# Patient Record
Sex: Female | Born: 1940 | Race: White | Hispanic: No | Marital: Married | Smoking: Never smoker
Health system: Southern US, Community
[De-identification: ages and names within clinical notes are randomized; demographics above are authoritative.]

## PROBLEM LIST (undated history)

## (undated) DIAGNOSIS — I10 Essential (primary) hypertension: Secondary | ICD-10-CM

## (undated) DIAGNOSIS — K219 Gastro-esophageal reflux disease without esophagitis: Secondary | ICD-10-CM

## (undated) DIAGNOSIS — E7800 Pure hypercholesterolemia, unspecified: Secondary | ICD-10-CM

## (undated) DIAGNOSIS — N390 Urinary tract infection, site not specified: Secondary | ICD-10-CM

## (undated) DIAGNOSIS — C801 Malignant (primary) neoplasm, unspecified: Secondary | ICD-10-CM

## (undated) DIAGNOSIS — Z9109 Other allergy status, other than to drugs and biological substances: Secondary | ICD-10-CM

## (undated) HISTORY — DX: Other allergy status, other than to drugs and biological substances: Z91.09

## (undated) HISTORY — PX: NO PAST SURGERIES: SHX2092

## (undated) HISTORY — DX: Urinary tract infection, site not specified: N39.0

## (undated) HISTORY — DX: Essential (primary) hypertension: I10

## (undated) HISTORY — DX: Pure hypercholesterolemia, unspecified: E78.00

## (undated) HISTORY — DX: Gastro-esophageal reflux disease without esophagitis: K21.9

---

## 2015-07-11 DIAGNOSIS — E7800 Pure hypercholesterolemia, unspecified: Secondary | ICD-10-CM | POA: Insufficient documentation

## 2015-07-11 DIAGNOSIS — K219 Gastro-esophageal reflux disease without esophagitis: Secondary | ICD-10-CM | POA: Insufficient documentation

## 2015-07-11 DIAGNOSIS — Z87440 Personal history of urinary (tract) infections: Secondary | ICD-10-CM | POA: Insufficient documentation

## 2015-11-05 ENCOUNTER — Other Ambulatory Visit: Payer: Self-pay | Admitting: Internal Medicine

## 2015-11-05 ENCOUNTER — Ambulatory Visit: Payer: Self-pay

## 2015-11-05 DIAGNOSIS — R05 Cough: Secondary | ICD-10-CM

## 2015-11-05 DIAGNOSIS — R053 Chronic cough: Secondary | ICD-10-CM

## 2015-11-07 ENCOUNTER — Ambulatory Visit
Admission: RE | Admit: 2015-11-07 | Discharge: 2015-11-07 | Disposition: A | Discharge status: Home / Self Care | Payer: Medicare HMO | Source: Ambulatory Visit | Attending: Internal Medicine | Admitting: Internal Medicine

## 2015-11-07 DIAGNOSIS — R05 Cough: Secondary | ICD-10-CM | POA: Insufficient documentation

## 2015-11-07 DIAGNOSIS — I2510 Atherosclerotic heart disease of native coronary artery without angina pectoris: Secondary | ICD-10-CM | POA: Insufficient documentation

## 2015-11-07 DIAGNOSIS — R918 Other nonspecific abnormal finding of lung field: Secondary | ICD-10-CM | POA: Diagnosis not present

## 2015-11-07 DIAGNOSIS — R053 Chronic cough: Secondary | ICD-10-CM

## 2015-11-17 ENCOUNTER — Other Ambulatory Visit: Payer: Self-pay | Admitting: Internal Medicine

## 2015-11-17 DIAGNOSIS — R05 Cough: Secondary | ICD-10-CM

## 2015-11-17 DIAGNOSIS — R053 Chronic cough: Secondary | ICD-10-CM

## 2015-11-17 DIAGNOSIS — J189 Pneumonia, unspecified organism: Secondary | ICD-10-CM

## 2015-11-25 ENCOUNTER — Other Ambulatory Visit: Payer: Self-pay | Admitting: Internal Medicine

## 2015-11-25 DIAGNOSIS — J189 Pneumonia, unspecified organism: Secondary | ICD-10-CM

## 2015-11-25 DIAGNOSIS — R05 Cough: Secondary | ICD-10-CM

## 2015-11-25 DIAGNOSIS — R053 Chronic cough: Secondary | ICD-10-CM

## 2015-12-16 ENCOUNTER — Ambulatory Visit
Admission: RE | Admit: 2015-12-16 | Discharge: 2015-12-16 | Disposition: A | Discharge status: Home / Self Care | Payer: Medicare HMO | Source: Ambulatory Visit | Attending: Internal Medicine | Admitting: Internal Medicine

## 2015-12-16 DIAGNOSIS — R05 Cough: Secondary | ICD-10-CM | POA: Diagnosis present

## 2015-12-16 DIAGNOSIS — J189 Pneumonia, unspecified organism: Secondary | ICD-10-CM | POA: Insufficient documentation

## 2015-12-16 DIAGNOSIS — R053 Chronic cough: Secondary | ICD-10-CM

## 2016-03-31 ENCOUNTER — Encounter: Payer: Self-pay | Admitting: Obstetrics and Gynecology

## 2016-03-31 ENCOUNTER — Ambulatory Visit (INDEPENDENT_AMBULATORY_CARE_PROVIDER_SITE_OTHER): Payer: Medicare HMO | Admitting: Obstetrics and Gynecology

## 2016-03-31 VITALS — BP 156/74 | HR 81 | Ht 62.0 in | Wt 183.0 lb

## 2016-03-31 DIAGNOSIS — R3915 Urgency of urination: Secondary | ICD-10-CM | POA: Diagnosis not present

## 2016-03-31 DIAGNOSIS — N390 Urinary tract infection, site not specified: Secondary | ICD-10-CM | POA: Diagnosis not present

## 2016-03-31 DIAGNOSIS — N814 Uterovaginal prolapse, unspecified: Secondary | ICD-10-CM

## 2016-03-31 DIAGNOSIS — Z780 Asymptomatic menopausal state: Secondary | ICD-10-CM | POA: Diagnosis not present

## 2016-03-31 DIAGNOSIS — N952 Postmenopausal atrophic vaginitis: Secondary | ICD-10-CM | POA: Diagnosis not present

## 2016-03-31 DIAGNOSIS — R339 Retention of urine, unspecified: Secondary | ICD-10-CM | POA: Diagnosis not present

## 2016-03-31 NOTE — Progress Notes (Signed)
GYN ENCOUNTER NOTE  Subjective:       Katie Beltran is a 75 y.o. [id number] female is here for gynecologic evaluation of the following issues:  1. Pelvic organ prolapse.    Menopause-late 50s 5 spontaneous vaginal deliveries, largest 9 lbs. 1 oz. No history of HRT use No current vasomotor symptoms  Patient reports pelvic pressure and protrusion of mass from the vaginal introitus which is progressively worsening over the past 3 years. She is experiencing recurrent UTIs. She does report having difficulty with staying clean after bowel movements because of the prolapsing mass. She does report backache feet with worsening of prolapse; prolapse improves with supine positioning.  GU: Urinary frequency 8 daily Nocturia 3 Mild urgency symptoms are notable. Stress urinary incontinence-negative Incomplete bladder emptying-at times Patient occasionally splints to initiate urinary stream. Currently on antibiotic prophylaxis-Macrodantin   Gynecologic History No LMP recorded. Patient is postmenopausal. Contraception: post menopausal status  Obstetric History OB History  Gravida Para Term Preterm AB Living  5 5 5     5   SAB TAB Ectopic Multiple Live Births          5    # Outcome Date GA Lbr Len/2nd Weight Sex Delivery Anes PTL Lv  5 Term 1974   8 lb 14.4 oz (4.037 kg) F Vag-Spont   LIV  4 Term 1967   9 lb 1.6 oz (4.128 kg) M Vag-Spont   LIV  3 Term 1965   6 lb 4.8 oz (2.858 kg) F Vag-Spont   LIV  2 Term 1963   7 lb 2.2 oz (3.239 kg) M Vag-Spont   LIV  1 Term 1962   8 lb 4.8 oz (3.765 kg) M Vag-Spont   LIV      Past Medical History:  Diagnosis Date  . Acid reflux   . Environmental allergies   . Hypercholesteremia   . Hypertension   . Recurrent UTI     Past Surgical History:  Procedure Laterality Date  . NO PAST SURGERIES      No current outpatient prescriptions on file prior to visit.   No current facility-administered medications on file prior to visit.     Allergies   Allergen Reactions  . Ciprofloxacin Other (See Comments)    Chest tightness  . Symbicort [Budesonide-Formoterol Fumarate]     Chills fever  . Prednisone Rash    Social History   Social History  . Marital status: Married    Spouse name: N/A  . Number of children: N/A  . Years of education: N/A   Occupational History  . Not on file.   Social History Main Topics  . Smoking status: Never Smoker  . Smokeless tobacco: Not on file  . Alcohol use No  . Drug use: No  . Sexual activity: No   Other Topics Concern  . Not on file   Social History Narrative  . No narrative on file    Family History  Problem Relation Age of Onset  . Diabetes Mother   . Heart disease Mother   . Diabetes Maternal Aunt   . Heart disease Maternal Aunt   . Heart disease Maternal Grandmother   . Colon cancer Son   . Breast cancer Neg Hx   . Ovarian cancer Neg Hx     The following portions of the patient's history were reviewed and updated as appropriate: allergies, current medications, past family history, past medical history, past social history, past surgical history and problem list.  Review  of Systems Review of Systems - Per history of present illness  Objective:   BP (!) 156/74   Pulse 81   Ht 5\' 2"  (1.575 m)   Wt 183 lb (83 kg)   BMI 33.47 kg/m  CONSTITUTIONAL: Well-developed, well-nourished female in no acute distress.  HENT:  Normocephalic, atraumatic.  NECK: Normal range of motion, supple, no masses.  Normal thyroid.  SKIN: Skin is warm and dry. No rash noted. Not diaphoretic. No erythema. No pallor. [place]: Alert and oriented to person, place, and time. PSYCHIATRIC: Normal mood and affect. Normal behavior. Normal judgment and thought content. CARDIOVASCULAR:Not Examined RESPIRATORY: Not Examined BREASTS: Not Examined ABDOMEN: Soft, non distended; Non tender.  No Organomegaly. PELVIC:  External Genitalia: Normal  BUS: Normal  Vagina: Mild to moderate atrophy;  third-degree cystocele; no rectocele  Cervix: Normal; parous, descends to mid vagina with Valsalva  Uterus: Normal size, shape,consistency, mobile, nontender  Adnexa: Normal, nonpalpable and nontender  RV: Normal external exam  Bladder: Nontender MUSCULOSKELETAL: Normal range of motion. No tenderness.  No cyanosis, clubbing, or edema.   PROCEDURE: Pessary fitting #4 ring with support-uncomfortable #3 incontinence dish with support-uncomfortable #2 incontinence dish with support-excellent  Assessment:   1. 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 Frequent UTI  3. Urinary urgency  4. Menopause  5. Vaginal atrophy  6. Incomplete bladder emptying     Plan:   1. Pessary fitting was completed today. A #2 incontinence dish with support is ordered 2. Pt will be contacted when the pessary arrives for insertion 3. Use estrogen cream intravaginal twice a week  A total of 45 minutes were spent face-to-face with the patient during the encounter with greater than 50% dealing with counseling and coordination of care.  Brayton Mars, MD  Note: This dictation was prepared with Dragon dictation along with smaller phrase technology. Any transcriptional errors that result from this process are unintentional.

## 2016-03-31 NOTE — Patient Instructions (Signed)
1. Pessary fitting was completed today. A #2 incontinence dish with support is ordered 2. You will be contacted when the pessary arrives for insertion 3. Use estrogen cream intravaginal twice a week

## 2016-04-13 ENCOUNTER — Ambulatory Visit (INDEPENDENT_AMBULATORY_CARE_PROVIDER_SITE_OTHER): Payer: Medicare HMO | Admitting: Obstetrics and Gynecology

## 2016-04-13 ENCOUNTER — Encounter: Payer: Self-pay | Admitting: Obstetrics and Gynecology

## 2016-04-13 VITALS — BP 168/73 | HR 85 | Wt 183.9 lb

## 2016-04-13 DIAGNOSIS — N952 Postmenopausal atrophic vaginitis: Secondary | ICD-10-CM | POA: Diagnosis not present

## 2016-04-13 DIAGNOSIS — N814 Uterovaginal prolapse, unspecified: Secondary | ICD-10-CM | POA: Diagnosis not present

## 2016-04-13 NOTE — Patient Instructions (Signed)
1. Return in 2 weeks for pessary maintenance

## 2016-04-13 NOTE — Progress Notes (Signed)
Chief complaint: 1. Third-degree cystocele with uterine prolapse to mid vagina 2. Vaginal atrophy 3. Incomplete bladder emptying  Patient presents for a new pessary insertion-#2 incontinence dish  OBJECTIVE: BP (!) 168/73   Pulse 85   Ht (P) 5\' 2"  (1.575 m)   Wt 183 lb 14.4 oz (83.4 kg)   BMI (P) 33.64 kg/m   PELVIC:             External Genitalia: Normal             BUS: Normal             Vagina: Mild to moderate atrophy; third-degree cystocele; no rectocele             Cervix: Normal; parous, descends to mid vagina with Valsalva             Uterus: Normal size, shape,consistency, mobile, nontender             Adnexa: Normal, nonpalpable and nontender             RV: Normal external exam             Bladder: Nontender  PROCEDURE: #2 incontinence dish inserted  ASSESSMENT: 1. Third-degree cystocele with uterine prolapse to mid vagina 2. Vaginal atrophy 3. Incomplete bladder emptying 4. Successful pessary insertion  PLAN: 1. Return in 2 weeks for follow-up-pessary maintenance 2. Use Trimosan gel weekly 3. Discussed use of Intrarosa for vaginal atrophy  A total of 15 minutes were spent face-to-face with the patient during this encounter and over half of that time dealt with counseling and coordination of care.   Brayton Mars, MD  Note: This dictation was prepared with Dragon dictation along with smaller phrase technology. Any transcriptional errors that result from this process are unintentional.

## 2016-04-15 ENCOUNTER — Encounter: Payer: Self-pay | Admitting: Obstetrics and Gynecology

## 2016-04-15 ENCOUNTER — Ambulatory Visit (INDEPENDENT_AMBULATORY_CARE_PROVIDER_SITE_OTHER): Payer: Medicare HMO | Admitting: Obstetrics and Gynecology

## 2016-04-15 VITALS — BP 156/78 | HR 89 | Ht 62.0 in | Wt 185.6 lb

## 2016-04-15 DIAGNOSIS — R102 Pelvic and perineal pain: Secondary | ICD-10-CM | POA: Insufficient documentation

## 2016-04-15 DIAGNOSIS — N814 Uterovaginal prolapse, unspecified: Secondary | ICD-10-CM

## 2016-04-15 DIAGNOSIS — R339 Retention of urine, unspecified: Secondary | ICD-10-CM

## 2016-04-15 DIAGNOSIS — Z4689 Encounter for fitting and adjustment of other specified devices: Secondary | ICD-10-CM | POA: Diagnosis not present

## 2016-04-15 NOTE — Patient Instructions (Signed)
Return for pessary appointment as scheduled or sooner if other problems develop

## 2016-04-15 NOTE — Progress Notes (Signed)
Chief complaint: 1. Pessary problem 2. Pelvic pain   Patient presents for evaluation of #2 incontinence dish pessary that has been placed for third-degree cystocele with uterine prolapse to mid vagina and incomplete bladder emptying. Most recently over the past day she has been experiencing left-sided pelvic discomfort. She tried to reposition the pessary and remove  the pessary without success. No vaginal bleeding or discharge.  Past medical history, past surgical history, problem list, medications, and allergies are reviewed  OBJECTIVE: BP (!) 156/78   Pulse 89   Ht 5\' 2"  (1.575 m)   Wt 185 lb 9.6 oz (84.2 kg)   BMI 33.95 kg/m  Pleasant female in no acute distress Abdomen: Soft, nontender; no suprapubic tenderness Pelvic: External genitalia-normal BUS-normal Vagina-normal mucosa; third-degree cystocele without rectocele is noted Cervix-normal; parous; descends to mid vagina Uterus-nontender Adnexa-nontender  Procedure: Pessary is removed, cleaned, and reinserted  ASSESSMENT: 1. Pelvic pain associated with pessary use and possible malposition of the pessary 2. No evidence of vaginitis or vaginal ulceration 3. No evidence of malpositioning of pessary at this time  PLAN: 1. Pessary is reinserted 2. Patient is to return as scheduled for reassessment 3. Patient is to return as needed if pelvic pain recurs.  A total of 15 minutes were spent face-to-face with the patient during this encounter and over half of that time dealt with counseling and coordination of care.  Brayton Mars, MD  Note: This dictation was prepared with Dragon dictation along with smaller phrase technology. Any transcriptional errors that result from this process are unintentional.

## 2016-04-19 ENCOUNTER — Telehealth: Payer: Self-pay | Admitting: Obstetrics and Gynecology

## 2016-04-19 NOTE — Telephone Encounter (Signed)
Pt called and she is req an appt to come in a get the pessary taken out due to it has been bothering her all weekend, dr de schedule is full so can you let me know where I can put her

## 2016-04-20 ENCOUNTER — Ambulatory Visit (INDEPENDENT_AMBULATORY_CARE_PROVIDER_SITE_OTHER): Payer: Medicare HMO | Admitting: Obstetrics and Gynecology

## 2016-04-20 ENCOUNTER — Encounter: Payer: Self-pay | Admitting: Obstetrics and Gynecology

## 2016-04-20 VITALS — BP 177/78 | HR 82 | Ht 62.0 in | Wt 184.2 lb

## 2016-04-20 DIAGNOSIS — R102 Pelvic and perineal pain: Secondary | ICD-10-CM | POA: Diagnosis not present

## 2016-04-20 DIAGNOSIS — N952 Postmenopausal atrophic vaginitis: Secondary | ICD-10-CM | POA: Diagnosis not present

## 2016-04-20 DIAGNOSIS — R339 Retention of urine, unspecified: Secondary | ICD-10-CM

## 2016-04-20 DIAGNOSIS — N814 Uterovaginal prolapse, unspecified: Secondary | ICD-10-CM | POA: Diagnosis not present

## 2016-04-20 NOTE — Progress Notes (Signed)
Chief complaint: 1. Pessary problem  Patient presents for follow-up regarding incontinence dish with support. She feels that the notch is migrating from time to time and is causing pelvic discomfort. She is interested in possibly getting refitted with a slightly larger disc without notch.  Past medical history, past surgical history, problem list, medications, and allergies are reviewed  OBJECTIVE: BP (!) 177/78   Pulse 82   Ht 5\' 2"  (1.575 m)   Wt 184 lb 3.2 oz (83.6 kg)   BMI 33.69 kg/m  Pleasant female in no acute distress Abdomen: Soft, nontender; no suprapubic tenderness Pelvic: External genitalia-normal BUS-normal Vagina-normal mucosa; third-degree cystocele without rectocele is noted Cervix-normal; parous; descends to mid vagina Uterus-nontender Adnexa-nontender  Procedure: Pessary is removed, cleaned, and not reinserted.  ASSESSMENT: 1. Pelvic pain associated with pessary use and possible malposition of the pessary 2. No evidence of vaginitis or vaginal ulceration 3. No evidence of malpositioning of pessary at this time 4. Pessary is left out at this time  PLAN: 1. Return in 1 week as scheduled for reassessment. A refitting of the pessary will be performed at next visit 2. Questions regarding surgical management were addressed.  A total of 15 minutes were spent face-to-face with the patient during this encounter and over half of that time dealt with counseling and coordination of care.  Brayton Mars, MD  Note: This dictation was prepared with Dragon dictation along with smaller phrase technology. Any transcriptional errors that result from this process are unintentional.

## 2016-04-20 NOTE — Patient Instructions (Signed)
1. Pessary is removed and not reinserted 2. Return in 1 week for pessary refitting.

## 2016-04-23 ENCOUNTER — Telehealth: Payer: Self-pay | Admitting: Obstetrics and Gynecology

## 2016-04-23 DIAGNOSIS — B3731 Acute candidiasis of vulva and vagina: Secondary | ICD-10-CM

## 2016-04-23 DIAGNOSIS — B373 Candidiasis of vulva and vagina: Secondary | ICD-10-CM

## 2016-04-23 MED ORDER — FLUCONAZOLE 150 MG PO TABS: 150.0000 mg | ORAL_TABLET | Freq: Once | ORAL | 1 refills | Status: AC

## 2016-04-23 NOTE — Telephone Encounter (Signed)
RX sent in. Pt to call back if sx do not improve.

## 2016-04-23 NOTE — Telephone Encounter (Signed)
Patient states she has developed a yeast infection due to her pessary. She wanted to know if something could be sent in for her. She uses the [facility] on [address].Thanks

## 2016-04-26 ENCOUNTER — Telehealth: Payer: Self-pay | Admitting: Obstetrics and Gynecology

## 2016-04-26 NOTE — Telephone Encounter (Signed)
Pt contacted office on Friday stating she had a y/i. Burning, itchy, and sore. OG erx Diflucan. Pts sx are better today. Advised diflucan will work up to 7 days. If sx no better on day 8 she may repeat her diflucan. Keep f/u appt on 11/16.

## 2016-04-26 NOTE — Telephone Encounter (Signed)
Patient would like to speak with you regarding her pessary and yeast infections. Thanks

## 2016-04-27 ENCOUNTER — Encounter: Payer: Medicare HMO | Admitting: Obstetrics and Gynecology

## 2016-05-13 ENCOUNTER — Encounter: Payer: Self-pay | Admitting: Obstetrics and Gynecology

## 2016-05-13 ENCOUNTER — Ambulatory Visit (INDEPENDENT_AMBULATORY_CARE_PROVIDER_SITE_OTHER): Payer: Medicare HMO | Admitting: Obstetrics and Gynecology

## 2016-05-13 VITALS — BP 151/79 | HR 87 | Ht 62.0 in | Wt 185.4 lb

## 2016-05-13 DIAGNOSIS — R3915 Urgency of urination: Secondary | ICD-10-CM | POA: Diagnosis not present

## 2016-05-13 DIAGNOSIS — R339 Retention of urine, unspecified: Secondary | ICD-10-CM | POA: Diagnosis not present

## 2016-05-13 DIAGNOSIS — N814 Uterovaginal prolapse, unspecified: Secondary | ICD-10-CM | POA: Diagnosis not present

## 2016-05-13 DIAGNOSIS — R102 Pelvic and perineal pain: Secondary | ICD-10-CM

## 2016-05-13 LAB — POCT URINALYSIS DIPSTICK
BILIRUBIN UA: NEGATIVE
Glucose, UA: NEGATIVE
KETONES UA: NEGATIVE
Leukocytes, UA: NEGATIVE
Nitrite, UA: NEGATIVE
PH UA: 6
SPEC GRAV UA: 1.02
Urobilinogen, UA: NEGATIVE

## 2016-05-13 NOTE — Patient Instructions (Signed)
1. A #4 ring with support pessary is fitted successfully today 2. Return in 2 weeks for new pessary insertion

## 2016-05-13 NOTE — Progress Notes (Signed)
Chief complaint: 1. Cystocele with uterine prolapse 2. Incomplete bladder emptying 3. Vaginal atrophy  Patient presents for further management of pelvic organ prolapse. The #2 incontinence dish with notch was not optimal for management of her prolapse. There was followed down of cystocele around the pessary previously fitted.  OBJECTIVE: BP (!) 151/79   Pulse 87   Ht 5\' 2"  (1.575 m)   Wt 185 lb 6.4 oz (84.1 kg)   BMI 33.91 kg/m  Abdomen: Soft, nontender; no suprapubic tenderness Pelvic: External genitalia-normal BUS-normal Vagina-normal mucosa; third-degree cystocele without rectocele is noted Cervix-normal; parous; descends to mid vagina Uterus-nontender Adnexa-nontender  PROCEDURE: Pessary fitting  Ring with support pessary-#4-SUCCESSFUL    ASSESSMENT: 1. Third-degree cystocele 2. Uterine prolapse, incomplete 3. Vaginal atrophy 4. Successful pessary fitting #4 ring with support  PLAN: 1. Ring with support pessary is ordered 2. Continue using Trimosan gel weekly 3. Return in 2 weeks when pessary arrives for insertion  A total of 15 minutes were spent face-to-face with the patient during this encounter and over half of that time dealt with counseling and coordination of care.  Brayton Mars, MD  Note: This dictation was prepared with Dragon dictation along with smaller phrase technology. Any transcriptional errors that result from this process are unintentional.

## 2016-05-13 NOTE — Addendum Note (Signed)
Addended by: Elouise Munroe on: 05/13/2016 03:59 PM   Modules accepted: Orders

## 2016-05-14 LAB — URINE CULTURE: Organism ID, Bacteria: NO GROWTH

## 2016-05-27 ENCOUNTER — Encounter: Payer: Medicare HMO | Admitting: Obstetrics and Gynecology

## 2016-06-02 ENCOUNTER — Ambulatory Visit (INDEPENDENT_AMBULATORY_CARE_PROVIDER_SITE_OTHER): Payer: Medicare HMO | Admitting: Obstetrics and Gynecology

## 2016-06-02 VITALS — BP 145/74 | HR 97 | Ht 62.0 in | Wt 181.9 lb

## 2016-06-02 DIAGNOSIS — N814 Uterovaginal prolapse, unspecified: Secondary | ICD-10-CM

## 2016-06-02 DIAGNOSIS — R339 Retention of urine, unspecified: Secondary | ICD-10-CM

## 2016-06-02 NOTE — Patient Instructions (Signed)
1. #4 ring with support pessary is inserted today 2. Return in 2 weeks for pessary maintenance 3. Use Trimosan gel intravaginally once a week

## 2016-06-02 NOTE — Progress Notes (Signed)
Chief complaint: 1. Pessary insertion-#4 ring with support pessary  Patient has been refitted with a #4 ring with support pessary. It is inserted today. She will return in 2 weeks for pessary maintenance. Trimosan gel intravaginally once a week is to be performed.  Brayton Mars, MD  Note: This dictation was prepared with Dragon dictation along with smaller phrase technology. Any transcriptional errors that result from this process are unintentional.

## 2016-06-03 ENCOUNTER — Telehealth: Payer: Self-pay | Admitting: Obstetrics and Gynecology

## 2016-06-03 ENCOUNTER — Encounter: Payer: Medicare HMO | Admitting: Obstetrics and Gynecology

## 2016-06-03 NOTE — Telephone Encounter (Signed)
Patient called back and stated she removed the pessary herself because she was in pain and causing her to urinate on herself. She also wanted to cancel her appointment on the 20th.

## 2016-06-03 NOTE — Telephone Encounter (Signed)
Pt was seen in office 06/02/16 for ring with support insertion. Pt was ok until last nite and this am. She states she was in pain and having incontinence. She removed pessary this am. Slight  pinkish d/c after removing. Will monitor for now. Pt did state after removing last pessary she had a y/i. Advised her to contact office if having sx. Will give diflucan. Will reevaluate first of the year.

## 2016-06-03 NOTE — Telephone Encounter (Signed)
Pt wants her pessary taken out, its not working and wants it out today. Before 930 or after 11 am

## 2016-06-16 ENCOUNTER — Encounter: Payer: Medicare HMO | Admitting: Obstetrics and Gynecology

## 2016-06-16 DIAGNOSIS — I25118 Atherosclerotic heart disease of native coronary artery with other forms of angina pectoris: Secondary | ICD-10-CM | POA: Insufficient documentation

## 2016-06-16 DIAGNOSIS — I6523 Occlusion and stenosis of bilateral carotid arteries: Secondary | ICD-10-CM | POA: Insufficient documentation

## 2016-10-14 DIAGNOSIS — I872 Venous insufficiency (chronic) (peripheral): Secondary | ICD-10-CM | POA: Insufficient documentation

## 2017-03-02 ENCOUNTER — Other Ambulatory Visit: Payer: Self-pay | Admitting: Internal Medicine

## 2017-03-02 DIAGNOSIS — Z1231 Encounter for screening mammogram for malignant neoplasm of breast: Secondary | ICD-10-CM

## 2017-04-11 ENCOUNTER — Other Ambulatory Visit: Payer: Self-pay | Admitting: Internal Medicine

## 2017-04-11 ENCOUNTER — Ambulatory Visit
Admission: RE | Admit: 2017-04-11 | Discharge: 2017-04-11 | Disposition: A | Discharge status: Home / Self Care | Payer: Medicare HMO | Source: Ambulatory Visit | Attending: Internal Medicine | Admitting: Internal Medicine

## 2017-04-11 DIAGNOSIS — Z1231 Encounter for screening mammogram for malignant neoplasm of breast: Secondary | ICD-10-CM | POA: Diagnosis present

## 2017-04-21 ENCOUNTER — Inpatient Hospital Stay
Admission: RE | Admit: 2017-04-21 | Discharge: 2017-04-21 | Disposition: A | Discharge status: Home / Self Care | Payer: Self-pay | Source: Ambulatory Visit | Attending: *Deleted | Admitting: *Deleted

## 2017-04-21 ENCOUNTER — Other Ambulatory Visit: Payer: Self-pay | Admitting: *Deleted

## 2017-04-21 DIAGNOSIS — Z9289 Personal history of other medical treatment: Secondary | ICD-10-CM

## 2018-04-06 ENCOUNTER — Other Ambulatory Visit: Payer: Self-pay | Admitting: Internal Medicine

## 2018-04-06 DIAGNOSIS — Z1231 Encounter for screening mammogram for malignant neoplasm of breast: Secondary | ICD-10-CM

## 2018-04-12 ENCOUNTER — Ambulatory Visit
Admission: RE | Admit: 2018-04-12 | Discharge: 2018-04-12 | Disposition: A | Discharge status: Home / Self Care | Payer: Medicare HMO | Source: Ambulatory Visit | Attending: Internal Medicine | Admitting: Internal Medicine

## 2018-04-12 DIAGNOSIS — Z1231 Encounter for screening mammogram for malignant neoplasm of breast: Secondary | ICD-10-CM | POA: Diagnosis not present

## 2018-04-12 HISTORY — DX: Malignant (primary) neoplasm, unspecified: C80.1

## 2018-05-18 ENCOUNTER — Other Ambulatory Visit: Payer: Self-pay | Admitting: Internal Medicine

## 2018-05-18 DIAGNOSIS — K219 Gastro-esophageal reflux disease without esophagitis: Secondary | ICD-10-CM

## 2018-05-23 ENCOUNTER — Ambulatory Visit: Payer: Medicare HMO

## 2018-05-24 ENCOUNTER — Ambulatory Visit
Admission: RE | Admit: 2018-05-24 | Discharge: 2018-05-24 | Disposition: A | Discharge status: Home / Self Care | Payer: Medicare HMO | Source: Ambulatory Visit | Attending: Internal Medicine | Admitting: Internal Medicine

## 2018-05-24 DIAGNOSIS — K449 Diaphragmatic hernia without obstruction or gangrene: Secondary | ICD-10-CM | POA: Diagnosis not present

## 2018-05-24 DIAGNOSIS — K219 Gastro-esophageal reflux disease without esophagitis: Secondary | ICD-10-CM | POA: Insufficient documentation

## 2018-06-28 HISTORY — PX: VAGINAL PROLAPSE REPAIR: SHX830

## 2019-01-27 IMAGING — MG MM DIGITAL SCREENING BILAT W/ TOMO W/ CAD
8 of 12 series · 8 of 28 positions shown · non-contrast
Comparison: Previous exam(s).

CLINICAL DATA: Screening.

EXAM:
2D DIGITAL SCREENING BILATERAL MAMMOGRAM WITH CAD AND ADJUNCT TOMO

[L MLO]
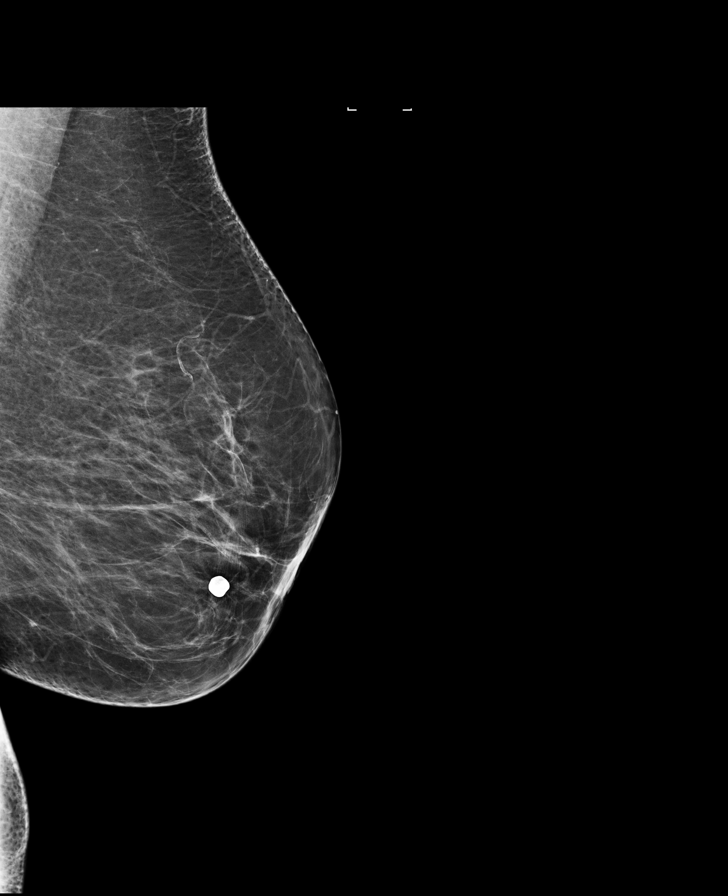

[R CC]
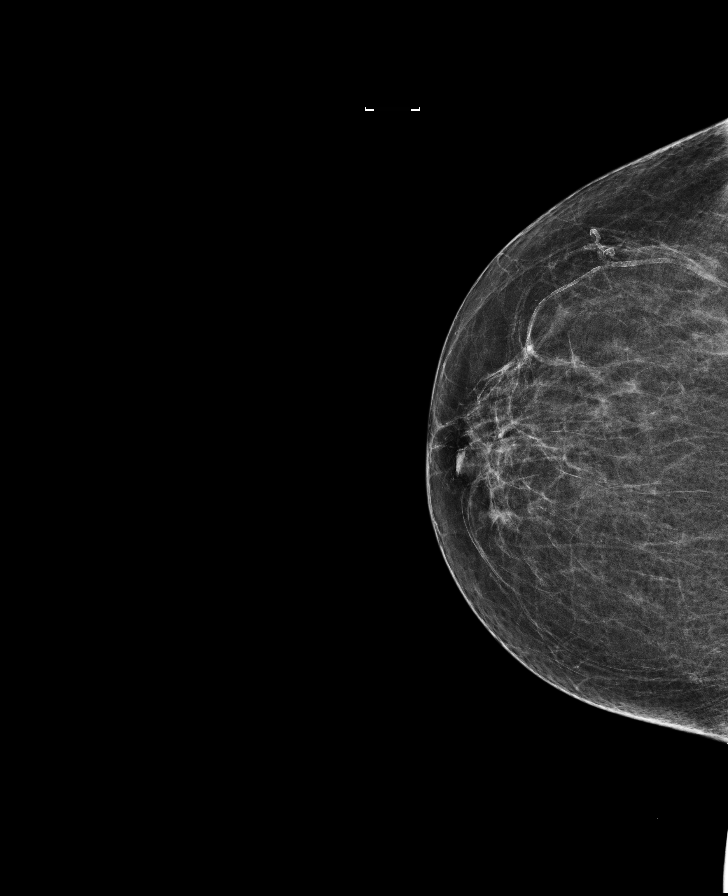

[R CC synth-2D]
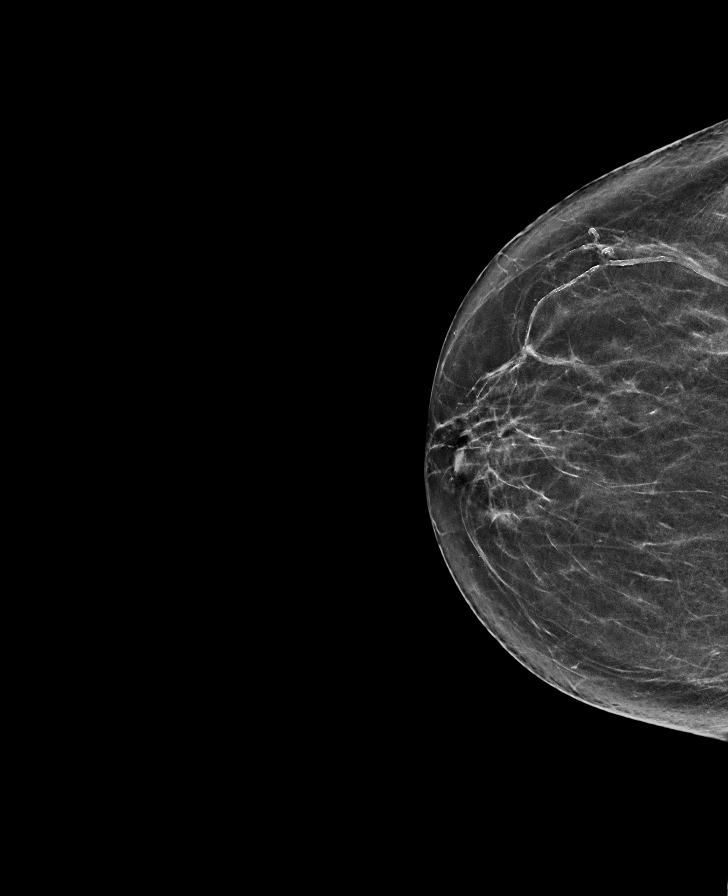

[L MLO synth-2D]
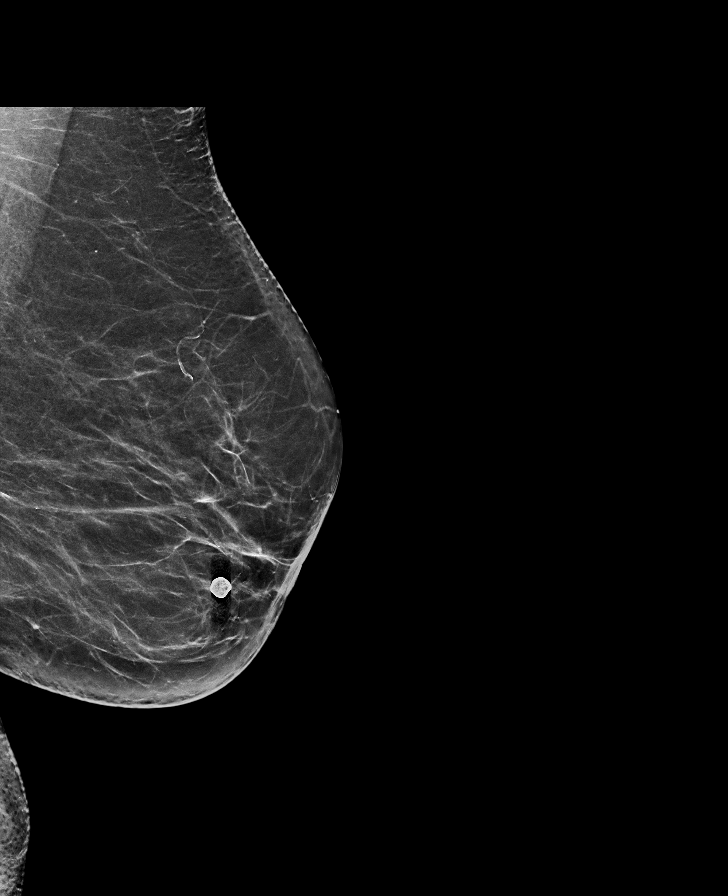

[L CC]
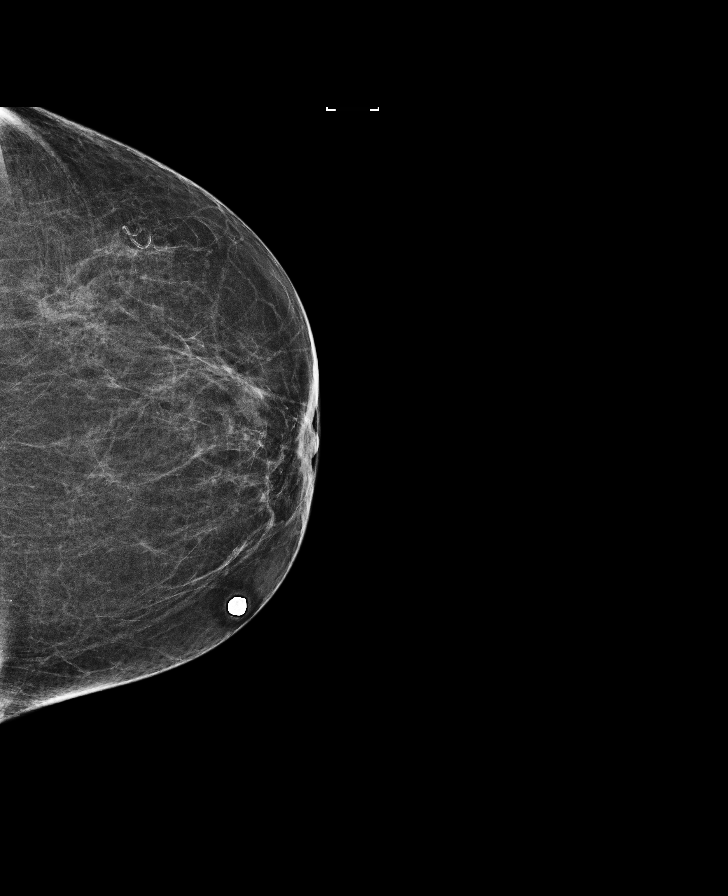

[L CC synth-2D]
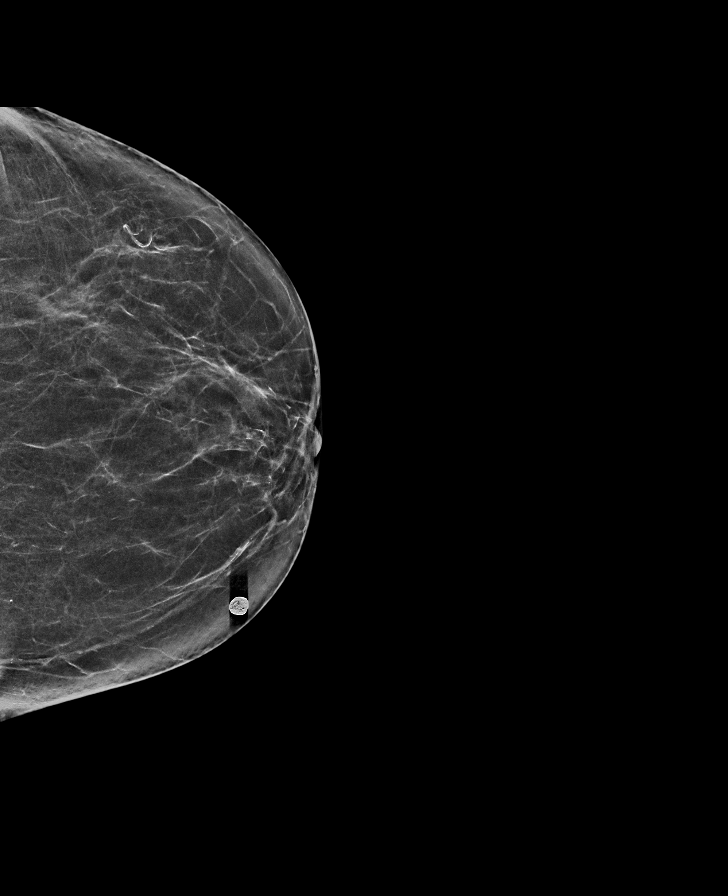

[R MLO synth-2D]
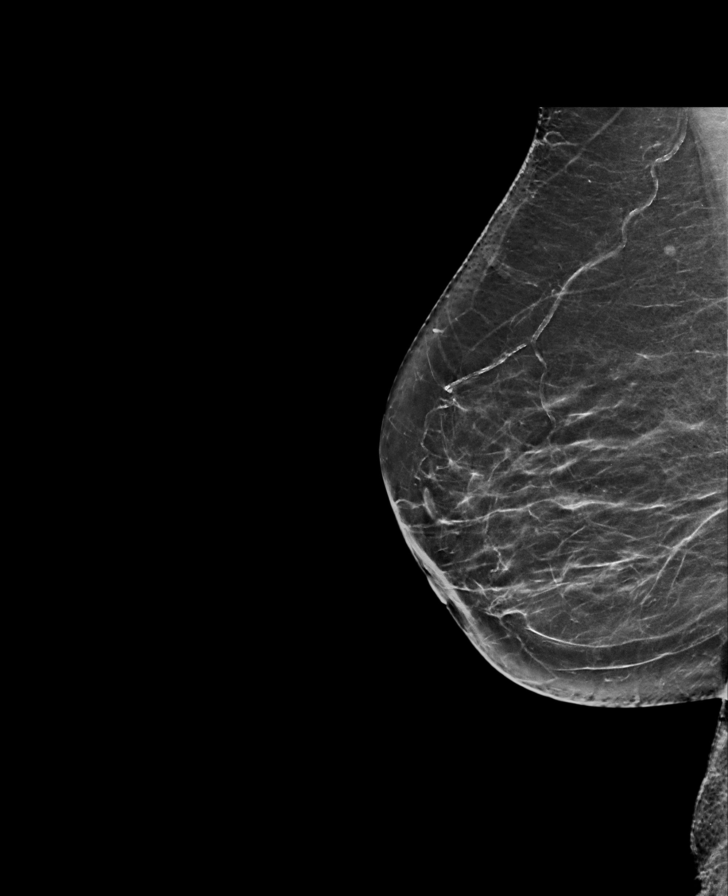

[R MLO]
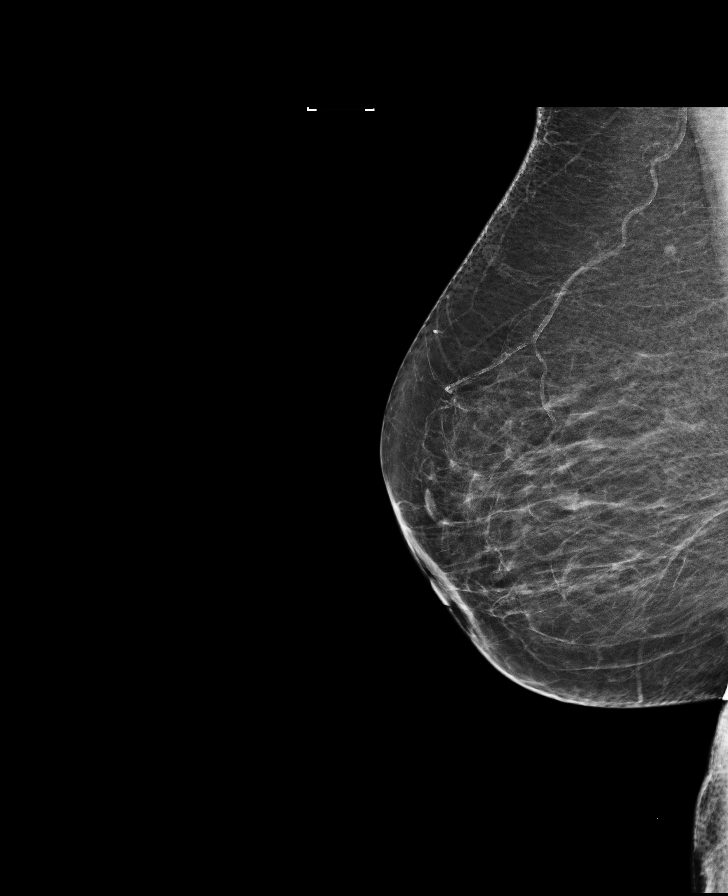

[8 of 28 positions shown; findings below may reference images not displayed]

ACR Breast Density Category b: There are scattered areas of
fibroglandular density.
FINDINGS: There are no findings suspicious for malignancy. Images were
processed with CAD.
IMPRESSION: No mammographic evidence of malignancy. A result letter of this
screening mammogram will be mailed directly to the patient.

RECOMMENDATION:
Screening mammogram in one year. (Code:97-6-RS4)

BI-RADS CATEGORY  1: Negative.

## 2019-03-16 ENCOUNTER — Other Ambulatory Visit: Payer: Self-pay | Admitting: Internal Medicine

## 2019-03-16 DIAGNOSIS — Z1231 Encounter for screening mammogram for malignant neoplasm of breast: Secondary | ICD-10-CM

## 2019-03-20 DIAGNOSIS — R42 Dizziness and giddiness: Secondary | ICD-10-CM | POA: Insufficient documentation

## 2019-04-17 ENCOUNTER — Other Ambulatory Visit: Payer: Self-pay

## 2019-04-17 ENCOUNTER — Ambulatory Visit
Admission: RE | Admit: 2019-04-17 | Discharge: 2019-04-17 | Disposition: A | Discharge status: Home / Self Care | Payer: Medicare HMO | Source: Ambulatory Visit | Attending: Internal Medicine | Admitting: Internal Medicine

## 2019-04-17 DIAGNOSIS — Z1231 Encounter for screening mammogram for malignant neoplasm of breast: Secondary | ICD-10-CM | POA: Diagnosis not present

## 2020-04-02 ENCOUNTER — Other Ambulatory Visit: Payer: Self-pay | Admitting: Internal Medicine

## 2020-04-02 DIAGNOSIS — Z1231 Encounter for screening mammogram for malignant neoplasm of breast: Secondary | ICD-10-CM

## 2020-04-17 ENCOUNTER — Other Ambulatory Visit: Payer: Self-pay

## 2020-04-17 ENCOUNTER — Ambulatory Visit
Admission: RE | Admit: 2020-04-17 | Discharge: 2020-04-17 | Disposition: A | Discharge status: Home / Self Care | Payer: Medicare HMO | Source: Ambulatory Visit | Attending: Internal Medicine | Admitting: Internal Medicine

## 2020-04-17 DIAGNOSIS — Z1231 Encounter for screening mammogram for malignant neoplasm of breast: Secondary | ICD-10-CM

## 2021-01-15 ENCOUNTER — Other Ambulatory Visit: Payer: Self-pay | Admitting: Internal Medicine

## 2021-01-15 DIAGNOSIS — G720 Drug-induced myopathy: Secondary | ICD-10-CM | POA: Insufficient documentation

## 2021-01-15 DIAGNOSIS — Z1231 Encounter for screening mammogram for malignant neoplasm of breast: Secondary | ICD-10-CM

## 2021-02-23 DIAGNOSIS — M2012 Hallux valgus (acquired), left foot: Secondary | ICD-10-CM | POA: Insufficient documentation

## 2021-02-23 DIAGNOSIS — M159 Polyosteoarthritis, unspecified: Secondary | ICD-10-CM | POA: Insufficient documentation

## 2021-04-29 ENCOUNTER — Other Ambulatory Visit (HOSPITAL_COMMUNITY): Payer: Self-pay | Admitting: Internal Medicine

## 2021-04-29 ENCOUNTER — Other Ambulatory Visit: Payer: Self-pay | Admitting: Internal Medicine

## 2021-04-29 DIAGNOSIS — R053 Chronic cough: Secondary | ICD-10-CM

## 2021-05-07 ENCOUNTER — Other Ambulatory Visit: Payer: Self-pay

## 2021-05-07 ENCOUNTER — Ambulatory Visit
Admission: RE | Admit: 2021-05-07 | Discharge: 2021-05-07 | Disposition: A | Discharge status: Home / Self Care | Payer: Medicare HMO | Source: Ambulatory Visit | Attending: Internal Medicine | Admitting: Internal Medicine

## 2021-05-07 DIAGNOSIS — R053 Chronic cough: Secondary | ICD-10-CM | POA: Insufficient documentation

## 2021-08-10 ENCOUNTER — Other Ambulatory Visit: Payer: Self-pay

## 2021-08-10 ENCOUNTER — Ambulatory Visit
Admission: RE | Admit: 2021-08-10 | Discharge: 2021-08-10 | Disposition: A | Discharge status: Home / Self Care | Payer: Medicare HMO | Source: Ambulatory Visit | Attending: Internal Medicine | Admitting: Internal Medicine

## 2021-08-10 DIAGNOSIS — Z1231 Encounter for screening mammogram for malignant neoplasm of breast: Secondary | ICD-10-CM | POA: Diagnosis not present

## 2021-11-18 ENCOUNTER — Ambulatory Visit: Payer: Medicare HMO | Admitting: Surgery

## 2021-12-16 ENCOUNTER — Ambulatory Visit: Payer: Medicare HMO | Admitting: Surgery

## 2022-02-01 ENCOUNTER — Ambulatory Visit: Payer: Medicare HMO | Admitting: Surgery

## 2022-02-01 ENCOUNTER — Telehealth: Payer: Self-pay

## 2022-02-01 ENCOUNTER — Other Ambulatory Visit: Payer: Self-pay

## 2022-02-01 ENCOUNTER — Encounter: Payer: Self-pay | Admitting: Surgery

## 2022-02-01 VITALS — BP 172/93 | HR 83 | Temp 98.1°F | Ht 62.0 in | Wt 163.4 lb

## 2022-02-01 DIAGNOSIS — K449 Diaphragmatic hernia without obstruction or gangrene: Secondary | ICD-10-CM

## 2022-02-01 NOTE — Telephone Encounter (Signed)
Called pt and she requested that I call her back at the end of the day

## 2022-02-01 NOTE — Telephone Encounter (Signed)
Got patient schedule for 02/25/2022 with Dr. Allen Norris. Offered patient 02/15/2022 and she had another appointment. Mailed paperwork to patient and went over instructions with patient

## 2022-02-01 NOTE — Telephone Encounter (Signed)
Cardiac Clearance faxed to [facility].

## 2022-02-01 NOTE — Patient Instructions (Addendum)
CT scheduled 02/03/22 @ 8:15 am @ [facility]. Please pick up prep kit today. Do not eat/drink anything 4 hours prior to this scan.Drink plenty of fluids after having your scan.   Barium swallow scheduled 02/10/22 @ 8:30 [facility]. Nothing to eat/drink 3 hours prior.    Referral to [facility]. Someone from their office will call to schedule an appointment. You may call there office @ [phone] to expedite the appointment process .    Please have all the test listed above prior to your follow up appointment with Dr.Pabon.   Cardiac Clearance faxed to [facility].    Hiatal Hernia  A hiatal hernia occurs when part of the stomach slides above the muscle that separates the abdomen from the chest (diaphragm). A person can be born with a hiatal hernia (congenital), or it may develop over time. In almost all cases of hiatal hernia, only the top part of the stomach pushes through the diaphragm. Many people have a hiatal hernia with no symptoms. The larger the hernia, the more likely it is that you will have symptoms. In some cases, a hiatal hernia allows stomach acid to flow back into the tube that carries food from your mouth to your stomach (esophagus). This may cause heartburn symptoms. The development of heartburn symptoms may mean that you have a condition called gastroesophageal reflux disease (GERD). What are the causes? This condition is caused by a weakness in the opening (hiatus) where the esophagus passes through the diaphragm to attach to the upper part of the stomach. A person may be born with a weakness in the hiatus, or a weakness can develop over time. What increases the risk? This condition is more likely to develop in: Older people. Age is a major risk factor for a hiatal hernia, especially if you are over the age of 39. Pregnant women. People who are overweight. People who have frequent constipation. What are the signs or symptoms? Symptoms of this condition usually  develop in the form of GERD symptoms. Symptoms include: Heartburn. Upset stomach (indigestion). Trouble swallowing. Coughing or wheezing. Wheezing is making high-pitched whistling sounds when you breathe. Sore throat. Chest pain. Nausea and vomiting. How is this diagnosed? This condition may be diagnosed during testing for GERD. Tests that may be done include: X-rays of your stomach or chest. An upper gastrointestinal (GI) series. This is an X-ray exam of your GI tract that is taken after you swallow a chalky liquid that shows up clearly on the X-ray. Endoscopy. This is a procedure to look into your stomach using a thin, flexible tube that has a tiny camera and light on the end of it. How is this treated? This condition may be treated by: Dietary and lifestyle changes to help reduce GERD symptoms. Medicines. These may include: Over-the-counter antacids. Medicines that make your stomach empty more quickly. Medicines that block the production of stomach acid (H2 blockers). Stronger medicines to reduce stomach acid (proton pump inhibitors). Surgery to repair the hernia, if other treatments are not helping. If you have no symptoms, you may not need treatment. Follow these instructions at home: Lifestyle and activity Do not use any products that contain nicotine or tobacco. These products include cigarettes, chewing tobacco, and vaping devices, such as e-cigarettes. If you need help quitting, ask your health care provider. Try to achieve and maintain a healthy body weight. Avoid putting pressure on your abdomen. Anything that puts pressure on your abdomen increases the amount of acid that may be pushed up into  your esophagus. Avoid bending over, especially after eating. Raise the head of your bed by putting blocks under the legs. This keeps your head and esophagus higher than your stomach. Do not wear tight clothing around your chest or stomach. Try not to strain when having a bowel  movement, when urinating, or when lifting heavy objects. Eating and drinking Avoid foods that can worsen GERD symptoms. These may include: Fatty foods, like fried foods. Citrus fruits, like oranges or lemon. Other foods and drinks that contain acid, like orange juice or tomatoes. Spicy food. Chocolate. Eat frequent small meals instead of three large meals a day. This helps prevent your stomach from getting too full. Eat slowly. Do not lie down right after eating. Do not eat 1-2 hours before bed. Do not drink beverages with caffeine. These include cola, coffee, cocoa, and tea. Do not drink alcohol. General instructions Take over-the-counter and prescription medicines only as told by your health care provider. Keep all follow-up visits. Your health care provider will want to check that any new prescribed medicines are helping your symptoms. Contact a health care provider if: Your symptoms are not controlled with medicines or lifestyle changes. You are having trouble swallowing. You have coughing or wheezing that will not go away. Your pain is getting worse. Your pain spreads to your arms, neck, jaw, teeth, or back. You feel nauseous or you vomit. Get help right away if: You have shortness of breath. You vomit blood. You have bright red blood in your stools. You have black, tarry stools. These symptoms may be an emergency. Get help right away. Call 911. Do not wait to see if the symptoms will go away. Do not drive yourself to the hospital. Summary A hiatal hernia occurs when part of the stomach slides above the muscle that separates the abdomen from the chest. A person may be born with a weakness in the hiatus, or a weakness can develop over time. Symptoms of a hiatal hernia may include heartburn, trouble swallowing, or sore throat. Management of a hiatal hernia includes eating frequent small meals instead of three large meals a day. Get help right away if you vomit blood, have  bright red blood in your stools, or have black, tarry stools. This information is not intended to replace advice given to you by your health care provider. Make sure you discuss any questions you have with your health care provider. Document Revised: 08/11/2021 Document Reviewed: 08/11/2021 Elsevier Patient Education  [place] After Nissen Fundoplication After a Nissen fundoplication procedure, it is common to have some difficulty swallowing. The part of your body that moves food and liquid from your mouth to your stomach (esophagus) will be swollen and may feel tight. It will take several weeks or months for your esophagus and stomach to heal. By following a special eating plan, you can prevent problems such as pain, swelling or pressure in the abdomen (bloating), gas, nausea, or diarrhea. What are tips for following this plan? Cooking Cook all foods until they are soft. Remove skins and seeds from fruits and vegetables before eating. Remove skin and gristle from meats. Grind or finely mince meats before eating. Avoid over-cooking meat. Dry, tough meat is more difficult to swallow. Avoid using oil when cooking, or use only a small amount of oil. Avoid using seasoning when cooking, or use only a small amount of seasoning. Toast bread before eating. This makes it easier to swallow. Meal planning  Eat 6-8 small meals throughout the  day. Right after the surgery, have a few meals that are only clear liquids. Clear liquids include: Water. Clear fruit juice, no pulp. Chicken, beef, or vegetable broth. Gelatin. Decaffeinated tea or coffee without milk. Popsicles or shaved ice. Depending on your progress, you may move to a full liquid diet as told by your health care provider. This includes clear liquids and the following: Dairy and alternative milks, such as soy milk. Strained creamed soups. Ice cream or sherbet. Pudding. Nutritional supplement drinks. Yogurt. A few  days after surgery, you may be able to start eating a diet of soft foods. You may need to eat according to this plan for several weeks. Do not eat sweets or sweetened drinks at the beginning of a meal. Doing that may cause your stomach to empty faster than it should (dumping syndrome). Lifestyle Always sit upright when eating or drinking. Eat slowly. Take small bites and chew food well before swallowing. Do not lie down after eating. Stay sitting up for 30 minutes or longer after each meal. Sip fluids between meals. Limit how much you drink at one time. With meals and snacks, have 4-8 oz (120-240 mL). This is equal to  cup-1 cup. Do not mix solid foods and liquids in the same mouthful. Drink enough fluid to keep your urine pale yellow. Do not chew gum or drink fluids through a straw. Doing those things may cause you to swallow extra air. General information Do not drink carbonated drinks or alcohol. Avoid foods and drinks that contain caffeine and chocolate. Avoid foods and drinks that contain citrus or tomato. Allow hot soups and drinks to cool before eating. Avoid foods that cause gas, such as beans, peas, broccoli, or cabbage. If dairy milk products cause diarrhea, avoid them or eat them in small amounts. Recommended foods Fruits Any soft-cooked fruits after skins and seeds are removed. Fruit juice. Vegetables Any soft-cooked vegetables after skins and seeds are removed. Vegetable juice. Grains Cooked cereals. Dry cereals softened with liquid. Cooked pasta, rice, or other grains. Toasted bread. Bland crackers, such as soda or graham crackers. Meats and other protein foods Tender cuts of meat, poultry, or fish after bones, skin, and gristle are removed. Poached, boiled, or scrambled eggs. Canned fish. Tofu. Creamy nut butters. Dairy Milk. Yogurt. Cottage cheese. Mild cheeses. Beverages Nutritional supplement drinks. Decaffeinated tea or coffee. Sports drinks. Fats and oils Butter.  Margarine. Mayonnaise. Vegetable oil. Smooth salad dressing. Sweets and desserts Plain hard candy. Marshmallows. Pudding. Ice cream. Gelatin. Sherbet. Seasoning and other foods Salt. Light seasonings. Mustard. Vinegar. The items listed above may not be a complete list of recommended foods and beverages. Contact a dietitian for more information. Foods to avoid Fruits Oranges. Grapefruit. Lemons. Limes. Citrus juices. Dried fruit. Crunchy, raw fruits. Vegetables Tomato sauce. Tomato juice. Broccoli. Cauliflower. Cabbage. Brussels sprouts. Crunchy, raw vegetables. Grains High-fiber or bran cereal. Cereal with nuts, dried fruit, or coconut. Sweet breads, rolls, coffee cake, or donuts. Chewy or crusty breads. Popcorn. Meats and other protein foods Beans, peas, and lentils. Tough or fatty meats. Fried meats, chicken, or fish. Fried eggs. Nuts and seeds. Crunchy nut butters. Dairy Chocolate milk. Yogurt with chunks of fruit, nuts, seeds, or coconut. Strong cheeses. Beverages Carbonated soft drinks. Alcohol. Cocoa. Hot drinks. Fats and oils Bacon fat. Lard. Sweets and desserts Chocolate. Candy with nuts, coconut, or seeds. Peppermint. Cookies. Cakes. Pie crust. Seasoning and other foods Heavy seasonings. Chili sauce. Ketchup. Barbecue sauce. Angie Fava. Horseradish. The items listed above may not be  a complete list of foods and beverages to avoid. Contact a dietitian for more information. Summary Following this eating plan after a Nissen fundoplication is an important part of healing after surgery. After surgery, you will start with a clear liquid diet before you progress to full liquids and soft foods. You may need to eat soft foods for several weeks. Avoid eating foods that cause irritation, gas, nausea, diarrhea, or swelling or pressure in the abdomen (bloating), and avoid foods that are difficult to swallow. Talk with a dietitian about which dietary choices are best for you. This information  is not intended to replace advice given to you by your health care provider. Make sure you discuss any questions you have with your health care provider. Document Revised: 12/30/2019 Document Reviewed: 12/30/2019 Elsevier Patient Education  [place].

## 2022-02-01 NOTE — Progress Notes (Signed)
Patient ID: Katie Beltran, female   DOB: [birth year], 81 y.o.   MRN: [id number]  HPI Katie Beltran is a 81 y.o. female seen in consultation at the request of Dr. Lanney Gins.  She reports some dysphagia as well as reflux symptoms with associated cough and chronic productive pulmonary congestion for the last several months.  She is states that rice usually is the most difficult meal to swallow.  Had a completed a colonoscopy but no recent EGD.  She did have a CT scan of the chest that I have personally reviewed showing evidence of a paraesophageal hernia.  There is chronic pulmonary changes.  She is 77 but she is very independent she is able to perform more than 4 METS of activity without any shortness of breath or chest pain.  No major abdominal operations.  She drives independently and she pays her own bills.  She walks without assistance.  SHe had a recent CBC and CMP that is completely normal  HPI  Past Medical History:  Diagnosis Date   Acid reflux    Environmental allergies    Hypercholesteremia    Hypertension    Recurrent UTI     Past Surgical History:  Procedure Laterality Date   NO PAST SURGERIES     VAGINAL PROLAPSE REPAIR  2020    Family History  Problem Relation Age of Onset   Diabetes Mother    Heart disease Mother    Colon cancer Son    Diabetes Maternal Aunt    Heart disease Maternal Aunt    Heart disease Maternal Grandmother    Breast cancer Neg Hx    Ovarian cancer Neg Hx     Social History Social History   Tobacco Use   Smoking status: Never   Smokeless tobacco: Never  Substance Use Topics   Alcohol use: No   Drug use: No    Allergies  Allergen Reactions   Propranolol Other (See Comments)    Vomiting, cough, throat irritation    Amlodipine Swelling   Ciprofloxacin Other (See Comments)    Chest tightness   Irbesartan Other (See Comments)   Lisinopril Other (See Comments)   Omeprazole Other (See Comments)    Leg pain and burning   Pantoprazole Other  (See Comments)    Leg pain and burning   Symbicort [Budesonide-Formoterol Fumarate]     Chills fever   Doxycycline Rash   Prednisone Rash    Current Outpatient Medications  Medication Sig Dispense Refill   ezetimibe (ZETIA) 10 MG tablet Take 10 mg by mouth daily.     famotidine (PEPCID) 20 MG tablet Take 20 mg by mouth 2 (two) times daily.     isosorbide dinitrate (ISORDIL) 30 MG tablet Take 30 mg by mouth 4 (four) times daily.     loratadine (CLARITIN) 10 MG tablet Take 10 mg by mouth daily.     zinc gluconate 50 MG tablet Take 50 mg by mouth daily.     No current facility-administered medications for this visit.     Review of Systems Full ROS  was asked and was negative except for the information on the HPI  Physical Exam Blood pressure (!) 172/93, pulse 83, temperature 98.1 F (36.7 C), temperature source Oral, height '5\' 2"'$  (1.575 m), weight 163 lb 6.4 oz (74.1 kg), SpO2 98 %. CONSTITUTIONAL: NAD. EYES: Pupils are equal, round,  Sclera are non-icteric. EARS, NOSE, MOUTH AND THROAT:  The oral mucosa is pink and moist. Hearing is intact to  voice. LYMPH NODES:  Lymph nodes in the neck are normal. RESPIRATORY:  Lungs are clear. There is normal respiratory effort, with equal breath sounds bilaterally, and without pathologic use of accessory muscles. CARDIOVASCULAR: Heart is regular, systolic murmur gallops, or rubs. GI: The abdomen is  soft, nontender, and nondistended. There are no palpable masses. There is no hepatosplenomegaly. There are normal bowel sounds in all quadrants. GU: Rectal deferred.   MUSCULOSKELETAL: Normal muscle strength and tone. No cyanosis or edema.   SKIN: Turgor is good and there are no pathologic skin lesions or ulcers. NEUROLOGIC: Motor and sensation is grossly normal. Cranial nerves are grossly intact. PSYCH:  Oriented to person, place and time. Affect is normal.  Data Reviewed  I have personally reviewed the patient's imaging, laboratory findings and  medical records.    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  She is feeding well but she has good cardiovascular performance and good performance status being very independent.  I had an extensive discussion with her and her daughter regarding her disease process.  The need for further work-up to include EGD, barium swallow and CT scan of the abdomen pelvis.  She understands that she may need an esophageal dilation as well as Tager surgical therapy based on further work-up.  We also had a discussion regarding robotic approach for repair of paraesophageal hernia  risks, benefits possible complications (including but not limited to: Bloating, chronic pain, recurrence, diarrhea, esophageal bowel injuries) as well as postoperative course including diet restrictions.  She seems interested in this. We will also request cardiac optimization from Dr. Nehemiah Massed I will see her back when she completes her work-up I spent greater than 60 minutes in this encounter including personally reviewing imaging studies, counseling the patient, coordinating her care, placing orders and performing appropriate documentation   Caroleen Hamman, MD [facility] 02/01/2022, 11:35 AM

## 2022-02-01 NOTE — Telephone Encounter (Signed)
will need EGD before paraesophageal hernia. she is 35 but in very decent shape. Can u guys help me getting this done in this month?  Ok per Dr Allen Norris

## 2022-02-02 NOTE — Telephone Encounter (Signed)
Pt has been scheduled for 8/31.

## 2022-02-03 ENCOUNTER — Ambulatory Visit: Admission: RE | Admit: 2022-02-03 | Payer: Medicare HMO | Source: Ambulatory Visit

## 2022-02-10 ENCOUNTER — Inpatient Hospital Stay: Admission: RE | Admit: 2022-02-10 | Payer: Medicare HMO | Source: Ambulatory Visit

## 2022-02-11 ENCOUNTER — Ambulatory Visit: Payer: Medicare HMO

## 2022-02-12 ENCOUNTER — Encounter: Payer: Self-pay | Admitting: Anesthesiology

## 2022-02-12 NOTE — Anesthesia Preprocedure Evaluation (Deleted)
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   Endo/Other  negative endocrine ROS  Renal/GU negative Renal ROS  negative genitourinary   Musculoskeletal   Abdominal   Peds  Hematology negative hematology ROS (+)   Anesthesia Other Findings  Pt with dysphagia as well as reflux symptoms with associated cough and chronic productive pulmonary congestion for the last several months  Past Medical History: No date: Acid reflux No date: Environmental allergies No date: Hypercholesteremia No date: Hypertension No date: Recurrent UTI  Past Surgical History: No date: NO PAST SURGERIES 2020: VAGINAL PROLAPSE REPAIR     Reproductive/Obstetrics negative OB ROS                            Anesthesia Physical Anesthesia Plan  ASA: 2  Anesthesia Plan: General   Post-op Pain Management: Minimal or no pain anticipated   Induction: Intravenous  PONV Risk Score and Plan: Propofol infusion and TIVA  Airway Management Planned: Natural Airway  Additional Equipment:   Intra-op Plan:   Post-operative Plan:   Informed Consent:     Dental Advisory Given  Plan Discussed with: Anesthesiologist, CRNA and Surgeon  Anesthesia Plan Comments:          Anesthesia Quick Evaluation

## 2022-02-16 ENCOUNTER — Telehealth: Payer: Self-pay | Admitting: Gastroenterology

## 2022-02-16 NOTE — Telephone Encounter (Signed)
Patient called and stated she needs to cancel her colonoscopy because she is having blood pressure issues. Patient states she will call back to reschedule when she gets her blood pressure where it needs to be.

## 2022-02-16 NOTE — Telephone Encounter (Signed)
Appointment has already been canceled

## 2022-02-17 ENCOUNTER — Telehealth: Payer: Self-pay

## 2022-02-17 NOTE — Telephone Encounter (Signed)
-----   Message from Lady Gary, RN sent at 02/16/2022 12:12 PM EDT ----- EGD pt for Dr Allen Norris for 8/31.  Reports she wants to postpone her procedure because her BP and HR have been erratic. Trying new meds with PCP.  Wants to be stable before procedure.  She said she would call your office to postpone.  Katie Beltran

## 2022-02-18 ENCOUNTER — Telehealth: Payer: Self-pay

## 2022-02-18 NOTE — Telephone Encounter (Signed)
Received call from receptionist at [facility] office-patient cancelled appointment with  cardiology and stated she was going to see Dr.Sparks today.   Patient will need to r/s her appointment with [facility] for pre-operative clearance. Spoke with patients husband and asked Mykell to call our office.    Medical clearance faxed today to Dr.Sparks.

## 2022-02-22 ENCOUNTER — Other Ambulatory Visit: Payer: Medicare HMO

## 2022-02-23 ENCOUNTER — Inpatient Hospital Stay: Admission: RE | Admit: 2022-02-23 | Payer: Medicare HMO | Source: Ambulatory Visit

## 2022-02-25 ENCOUNTER — Ambulatory Visit: Admit: 2022-02-25 | Payer: Medicare HMO | Admitting: Gastroenterology

## 2022-02-25 SURGERY — ESOPHAGOGASTRODUODENOSCOPY (EGD) WITH PROPOFOL
Anesthesia: Choice

## 2022-03-02 ENCOUNTER — Telehealth: Payer: Self-pay

## 2022-03-02 NOTE — Telephone Encounter (Signed)
Spoke with patient today to follow up on Medical /cardiology clearances- patient stated she has cancelled her CT scan/ Barium swallow and EGD due to elevated blood pressure. She will contact the office to let us know if she decides to move forward with surgery.

## 2022-03-22 ENCOUNTER — Ambulatory Visit: Payer: Medicare HMO | Admitting: Surgery

## 2022-06-03 ENCOUNTER — Other Ambulatory Visit: Payer: Self-pay

## 2022-06-03 DIAGNOSIS — Z1231 Encounter for screening mammogram for malignant neoplasm of breast: Secondary | ICD-10-CM

## 2022-08-12 ENCOUNTER — Ambulatory Visit
Admission: RE | Admit: 2022-08-12 | Discharge: 2022-08-12 | Disposition: A | Discharge status: Home / Self Care | Payer: Medicare HMO | Source: Ambulatory Visit | Attending: Internal Medicine | Admitting: Internal Medicine

## 2022-08-12 DIAGNOSIS — Z1231 Encounter for screening mammogram for malignant neoplasm of breast: Secondary | ICD-10-CM | POA: Diagnosis present

## 2022-12-27 ENCOUNTER — Ambulatory Visit
Admission: EM | Admit: 2022-12-27 | Discharge: 2022-12-27 | Disposition: A | Discharge status: Home / Self Care | Payer: Medicare HMO | Attending: Physician Assistant | Admitting: Physician Assistant

## 2022-12-27 DIAGNOSIS — B3731 Acute candidiasis of vulva and vagina: Secondary | ICD-10-CM | POA: Insufficient documentation

## 2022-12-27 DIAGNOSIS — M545 Low back pain, unspecified: Secondary | ICD-10-CM | POA: Insufficient documentation

## 2022-12-27 LAB — URINALYSIS, W/ REFLEX TO CULTURE (INFECTION SUSPECTED)
Bilirubin Urine: NEGATIVE
Glucose, UA: NEGATIVE mg/dL
Ketones, ur: NEGATIVE mg/dL
Leukocytes,Ua: NEGATIVE
Nitrite: NEGATIVE
Protein, ur: 30 mg/dL — AB
Specific Gravity, Urine: 1.025 (ref 1.005–1.030)
pH: 5.5 (ref 5.0–8.0)

## 2022-12-27 MED ORDER — FLUCONAZOLE 150 MG PO TABS
150.0000 mg | ORAL_TABLET | Freq: Every day | ORAL | 0 refills | Status: AC
Start: 2022-12-27 — End: 2022-12-28

## 2022-12-27 NOTE — ED Triage Notes (Addendum)
Pt presents to UC c/o RT sided flank pain onset Saturday intermittent since then. Pt also started having some congestion yesterday (yellow color) but none today. Pt declined testing for covid.

## 2022-12-27 NOTE — ED Provider Notes (Signed)
[facility]    CSN: [id number] Arrival date & time: 12/27/22  0934      History   Chief Complaint Chief Complaint  Patient presents with   Flank Pain   Nasal Congestion    HPI Katie Beltran is a 82 y.o. female presenting for right lower back pain for the past couple days.  She reports pain is intermittent.  Pain does not radiate.  No exacerbating factors.  Improvement with Tylenol, heat and ice as well as topical essential oils.  Patient reports similar symptoms due to a muscle problem every now and then.  However, she reports she was treated for UTI few weeks ago and wants to make sure it is cleared up.  Denies urinary frequency, urgency, dysuria, vaginal discharge or odor.  Additionally she reports she coughed up mucus yesterday but has not had a continued cough.  She wonders if that could have caused her back pain or be related in someway.  No fever or shortness of breath.  Patient going out of town to [place] this afternoon.  No other complaints.    HPI  Past Medical History:  Diagnosis Date   Acid reflux    Environmental allergies    Hypercholesteremia    Hypertension    Recurrent UTI     Patient Active Problem List   Diagnosis Date Noted   Primary osteoarthritis involving multiple joints 02/23/2021   Valgus deformity of both great toes 02/23/2021   Statin myopathy 01/15/2021   Dizziness 03/20/2019   Venous insufficiency of both lower extremities 10/14/2016   Bilateral carotid artery stenosis 06/16/2016   Coronary artery disease of native artery of native heart with stable angina pectoris (HCC) 06/16/2016   Pelvic pain 04/15/2016   Pessary maintenance 04/15/2016   Incomplete bladder emptying 03/31/2016   Vaginal atrophy 03/31/2016   Menopause 03/31/2016   Urinary urgency 03/31/2016   Frequent UTI 03/31/2016   Cystocele with uterine prolapse 03/31/2016   GERD without esophagitis 07/11/2015   History of recurrent UTIs 07/11/2015   Pure  hypercholesterolemia 07/11/2015    Past Surgical History:  Procedure Laterality Date   NO PAST SURGERIES     VAGINAL PROLAPSE REPAIR  2020    OB History     Gravida  5   Para  5   Term  5   Preterm      AB      Living  5      SAB      IAB      Ectopic      Multiple      Live Births  5            Home Medications    Prior to Admission medications   Medication Sig Start Date End Date Taking? Authorizing Provider  fluconazole (DIFLUCAN) 150 MG tablet Take 1 tablet (150 mg total) by mouth daily for 1 day. 12/27/22 12/28/22 Yes Shirlee Latch, PA-C  ezetimibe (ZETIA) 10 MG tablet Take 10 mg by mouth daily.    [provider]  famotidine (PEPCID) 20 MG tablet Take 20 mg by mouth 2 (two) times daily.    [provider]  isosorbide dinitrate (ISORDIL) 30 MG tablet Take 30 mg by mouth 4 (four) times daily.    [provider]  loratadine (CLARITIN) 10 MG tablet Take 10 mg by mouth daily.    [provider]  zinc gluconate 50 MG tablet Take 50 mg by mouth daily.  [provider]    Family History Family History  Problem Relation Age of Onset   Diabetes Mother    Heart disease Mother    Colon cancer Son    Diabetes Maternal Aunt    Heart disease Maternal Aunt    Heart disease Maternal Grandmother    Breast cancer Neg Hx    Ovarian cancer Neg Hx     Social History Social History   Tobacco Use   Smoking status: Never   Smokeless tobacco: Never  Substance Use Topics   Alcohol use: No   Drug use: No     Allergies   Propranolol, Amlodipine, Ciprofloxacin, Irbesartan, Lisinopril, Omeprazole, Pantoprazole, Symbicort [budesonide-formoterol fumarate], Doxycycline, and Prednisone   Review of Systems Review of Systems  Constitutional:  Negative for chills, diaphoresis, fatigue and fever.  HENT:  Positive for congestion. Negative for ear pain, rhinorrhea, sinus pressure, sinus pain and sore throat.    Respiratory:  Negative for cough and shortness of breath.   Gastrointestinal:  Negative for abdominal pain, constipation, diarrhea, nausea and vomiting.  Genitourinary:  Positive for flank pain. Negative for decreased urine volume, difficulty urinating and dysuria.  Musculoskeletal:  Positive for back pain. Negative for arthralgias and myalgias.  Skin:  Negative for rash.  Neurological:  Negative for weakness and headaches.  Hematological:  Negative for adenopathy.     Physical Exam Triage Vital Signs ED Triage Vitals  Enc Vitals Group     BP      Pulse      Resp      Temp      Temp src      SpO2      Weight      Height      Head Circumference      Peak Flow      Pain Score      Pain Loc      Pain Edu?      Excl. in GC?    No data found.  Updated Vital Signs BP (!) 169/82 (BP Location: Right Arm)   Pulse 83   Temp 98 F (36.7 C) (Oral)   SpO2 97%   Physical Exam Vitals and nursing note reviewed.  Constitutional:      General: She is not in acute distress.    Appearance: Normal appearance. She is not ill-appearing or toxic-appearing.  HENT:     Head: Normocephalic and atraumatic.     Nose: Nose normal.     Mouth/Throat:     Mouth: Mucous membranes are moist.     Pharynx: Oropharynx is clear.  Eyes:     General: No scleral icterus.       Right eye: No discharge.        Left eye: No discharge.     Conjunctiva/sclera: Conjunctivae normal.  Cardiovascular:     Rate and Rhythm: Normal rate and regular rhythm.     Heart sounds: Normal heart sounds.  Pulmonary:     Effort: Pulmonary effort is normal. No respiratory distress.     Breath sounds: Normal breath sounds.  Abdominal:     Palpations: Abdomen is soft.     Tenderness: There is no abdominal tenderness. There is no right CVA tenderness or left CVA tenderness.  Musculoskeletal:     Cervical back: Neck supple.     Lumbar back: Tenderness (right paralumbar muscles) present. No bony tenderness. Normal range  of motion.  Skin:    General: Skin is dry.  Neurological:  General: No focal deficit present.     Mental Status: She is alert. Mental status is at baseline.     Motor: No weakness.     Gait: Gait normal.  Psychiatric:        Mood and Affect: Mood normal.        Behavior: Behavior normal.        Thought Content: Thought content normal.      UC Treatments / Results  Labs (all labs ordered are listed, but only abnormal results are displayed) Labs Reviewed  URINALYSIS, W/ REFLEX TO CULTURE (INFECTION SUSPECTED) - Abnormal; Notable for the following components:      Result Value   Hgb urine dipstick MODERATE (*)    Protein, ur 30 (*)    Bacteria, UA MANY (*)    All other components within normal limits    EKG   Radiology No results found.  Procedures Procedures (including critical care time)  Medications Ordered in UC Medications - No data to display  Initial Impression / Assessment and Plan / UC Course  I have reviewed the triage vital signs and the nursing notes.  Pertinent labs & imaging results that were available during my care of the patient were reviewed by me and considered in my medical decision making (see chart for details).   82 y/o female presents for right lower back pain x 2 days. Reports UTI a few weeks ago. Took full course of meds.  Reports history of pain that comes and goes in the past which was related to a muscle.  States this feels similar.  Has tried Tylenol, ibuprofen, peppermint oil, heat, ice with some relief.  On exam she has tenderness to the right paralumbar region.  No CVA tenderness.  Chest clear to auscultation.  Urinalysis not consistent with UTI.  There is microscopic blood but she has had this before.  She denies any history of kidney stones.  There is budding yeast.  Will cover her for yeast infection as I do not want that to get worse and she is going out of town.  Sent 1 Diflucan.  Back pain likely musculoskeletal.  Patient  reports she has muscle relaxer at home she can take.  Advised to continue with Tylenol and NSAIDs if able to take this, heat, ice, muscle rubs.  Reviewed return and ER precautions.   Final Clinical Impressions(s) / UC Diagnoses   Final diagnoses:  Acute right-sided low back pain without sciatica  Vaginal yeast infection     Discharge Instructions      -Urine negative for UTI. -You do not have a UTI -Back pain likely due to muscle discomfort. Continue Tylenol, heat, ice, muscle relaxer. -If fever, fatigue, chills, worsening pain go to ED.  BACK PAIN: Stressed avoiding painful activities . RICE (REST, ICE, COMPRESSION, ELEVATION) guidelines reviewed. May alternate ice and heat. Consider use of muscle rubs, Salonpas patches, etc. Use medications as directed including muscle relaxers if prescribed. Take anti-inflammatory medications as prescribed or OTC NSAIDs/Tylenol.  F/u with PCP in 7-10 days for reexamination, and please feel free to call or return to the urgent care at any time for any questions or concerns you may have and we will be happy to help you!   BACK PAIN RED FLAGS: If the back pain acutely worsens or there are any red flag symptoms such as numbness/tingling, leg weakness, saddle anesthesia, or loss of bowel/bladder control, go immediately to the ER. Follow up with [place] as scheduled or sooner if  the pain does not begin to resolve or if it worsens before the follow up       ED Prescriptions     Medication Sig Dispense Auth. Provider   fluconazole (DIFLUCAN) 150 MG tablet Take 1 tablet (150 mg total) by mouth daily for 1 day. 1 tablet Gareth Morgan      PDMP not reviewed this encounter.   Shirlee Latch, PA-C 12/27/22 1140

## 2022-12-27 NOTE — Discharge Instructions (Addendum)
-  Urine negative for UTI. -You do not have a UTI -Back pain likely due to muscle discomfort. Continue Tylenol, heat, ice, muscle relaxer. -If fever, fatigue, chills, worsening pain go to ED.  BACK PAIN: Stressed avoiding painful activities . RICE (REST, ICE, COMPRESSION, ELEVATION) guidelines reviewed. May alternate ice and heat. Consider use of muscle rubs, Salonpas patches, etc. Use medications as directed including muscle relaxers if prescribed. Take anti-inflammatory medications as prescribed or OTC NSAIDs/Tylenol.  F/u with PCP in 7-10 days for reexamination, and please feel free to call or return to the urgent care at any time for any questions or concerns you may have and we will be happy to help you!   BACK PAIN RED FLAGS: If the back pain acutely worsens or there are any red flag symptoms such as numbness/tingling, leg weakness, saddle anesthesia, or loss of bowel/bladder control, go immediately to the ER. Follow up with [place] as scheduled or sooner if the pain does not begin to resolve or if it worsens before the follow up

## 2023-01-24 ENCOUNTER — Other Ambulatory Visit: Payer: Self-pay | Admitting: Internal Medicine

## 2023-01-24 DIAGNOSIS — I10 Essential (primary) hypertension: Secondary | ICD-10-CM

## 2023-01-24 DIAGNOSIS — M2550 Pain in unspecified joint: Secondary | ICD-10-CM

## 2023-01-26 ENCOUNTER — Ambulatory Visit
Admission: RE | Admit: 2023-01-26 | Discharge: 2023-01-26 | Disposition: A | Discharge status: Home / Self Care | Payer: Medicare HMO | Source: Ambulatory Visit | Attending: Internal Medicine | Admitting: Internal Medicine

## 2023-01-26 DIAGNOSIS — I10 Essential (primary) hypertension: Secondary | ICD-10-CM | POA: Insufficient documentation

## 2023-01-26 DIAGNOSIS — M2550 Pain in unspecified joint: Secondary | ICD-10-CM | POA: Insufficient documentation

## 2023-01-27 ENCOUNTER — Inpatient Hospital Stay
Admit: 2023-01-27 | Discharge: 2023-01-27 | Disposition: A | Discharge status: Home / Self Care | Payer: Medicare HMO | Attending: Cardiology | Admitting: Cardiology

## 2023-01-27 ENCOUNTER — Emergency Department: Payer: Medicare HMO

## 2023-01-27 ENCOUNTER — Inpatient Hospital Stay
Admission: EM | Admit: 2023-01-27 | Discharge: 2023-01-29 | DRG: 321 | Disposition: A | Discharge status: Home / Self Care | Payer: Medicare HMO | Attending: Internal Medicine | Admitting: Internal Medicine

## 2023-01-27 ENCOUNTER — Other Ambulatory Visit: Payer: Self-pay

## 2023-01-27 ENCOUNTER — Encounter: Payer: Self-pay | Admitting: Emergency Medicine

## 2023-01-27 DIAGNOSIS — D72829 Elevated white blood cell count, unspecified: Secondary | ICD-10-CM | POA: Diagnosis not present

## 2023-01-27 DIAGNOSIS — E7800 Pure hypercholesterolemia, unspecified: Secondary | ICD-10-CM | POA: Diagnosis present

## 2023-01-27 DIAGNOSIS — Z79899 Other long term (current) drug therapy: Secondary | ICD-10-CM | POA: Diagnosis not present

## 2023-01-27 DIAGNOSIS — K219 Gastro-esophageal reflux disease without esophagitis: Secondary | ICD-10-CM | POA: Diagnosis present

## 2023-01-27 DIAGNOSIS — I160 Hypertensive urgency: Secondary | ICD-10-CM | POA: Diagnosis present

## 2023-01-27 DIAGNOSIS — I10 Essential (primary) hypertension: Secondary | ICD-10-CM | POA: Diagnosis present

## 2023-01-27 DIAGNOSIS — Z8249 Family history of ischemic heart disease and other diseases of the circulatory system: Secondary | ICD-10-CM | POA: Diagnosis not present

## 2023-01-27 DIAGNOSIS — Z881 Allergy status to other antibiotic agents status: Secondary | ICD-10-CM | POA: Diagnosis not present

## 2023-01-27 DIAGNOSIS — I4891 Unspecified atrial fibrillation: Secondary | ICD-10-CM | POA: Diagnosis not present

## 2023-01-27 DIAGNOSIS — Z800 Family history of malignant neoplasm of digestive organs: Secondary | ICD-10-CM

## 2023-01-27 DIAGNOSIS — Z833 Family history of diabetes mellitus: Secondary | ICD-10-CM | POA: Diagnosis not present

## 2023-01-27 DIAGNOSIS — I214 Non-ST elevation (NSTEMI) myocardial infarction: Secondary | ICD-10-CM | POA: Diagnosis present

## 2023-01-27 DIAGNOSIS — I429 Cardiomyopathy, unspecified: Secondary | ICD-10-CM | POA: Diagnosis present

## 2023-01-27 DIAGNOSIS — Z7901 Long term (current) use of anticoagulants: Secondary | ICD-10-CM

## 2023-01-27 DIAGNOSIS — U071 COVID-19: Secondary | ICD-10-CM | POA: Diagnosis present

## 2023-01-27 DIAGNOSIS — I2510 Atherosclerotic heart disease of native coronary artery without angina pectoris: Secondary | ICD-10-CM | POA: Diagnosis present

## 2023-01-27 DIAGNOSIS — Z888 Allergy status to other drugs, medicaments and biological substances status: Secondary | ICD-10-CM | POA: Diagnosis not present

## 2023-01-27 LAB — COMPREHENSIVE METABOLIC PANEL
ALT: 16 U/L (ref 0–44)
AST: 30 U/L (ref 15–41)
Albumin: 3.6 g/dL (ref 3.5–5.0)
Alkaline Phosphatase: 65 U/L (ref 38–126)
Anion gap: 6 (ref 5–15)
BUN: 17 mg/dL (ref 8–23)
CO2: 22 mmol/L (ref 22–32)
Calcium: 8.6 mg/dL — ABNORMAL LOW (ref 8.9–10.3)
Chloride: 105 mmol/L (ref 98–111)
Creatinine, Ser: 0.61 mg/dL (ref 0.44–1.00)
GFR, Estimated: >60 mL/min (ref >60)
Glucose, Bld: 176 mg/dL — ABNORMAL HIGH (ref 70–99)
Potassium: 3.9 mmol/L (ref 3.5–5.1)
Sodium: 133 mmol/L — ABNORMAL LOW (ref 135–145)
Total Bilirubin: 0.7 mg/dL (ref 0.3–1.2)
Total Protein: 7.4 g/dL (ref 6.5–8.1)

## 2023-01-27 LAB — ECHOCARDIOGRAM COMPLETE
AR max vel: 1.14 cm2
AV Area VTI: 1.26 cm2
AV Area mean vel: 1.06 cm2
AV Mean grad: 9.5 mmHg
AV Peak grad: 15.9 mmHg
Ao pk vel: 2 m/s
Area-P 1/2: 5.58 cm2
Calc EF: 30.8 %
Height: 62 in
MV VTI: 2.27 cm2
S' Lateral: 3.4 cm
Single Plane A2C EF: 21.1 %
Single Plane A4C EF: 42.7 %
Weight: 2512 oz

## 2023-01-27 LAB — CBC
HCT: 40.9 % (ref 36.0–46.0)
Hemoglobin: 13.3 g/dL (ref 12.0–15.0)
MCH: 27.9 pg (ref 26.0–34.0)
MCHC: 32.5 g/dL (ref 30.0–36.0)
MCV: 85.9 fL (ref 80.0–100.0)
Platelets: 315 10*3/uL (ref 150–400)
RBC: 4.76 MIL/uL (ref 3.87–5.11)
RDW: 15 % (ref 11.5–15.5)
WBC: 13.9 10*3/uL — ABNORMAL HIGH (ref 4.0–10.5)
nRBC: 0 % (ref 0.0–0.2)

## 2023-01-27 LAB — TROPONIN I (HIGH SENSITIVITY)
Troponin I (High Sensitivity): 11072 ng/L (ref <18)
Troponin I (High Sensitivity): 11651 ng/L (ref <18)
Troponin I (High Sensitivity): 1390 ng/L (ref <18)
Troponin I (High Sensitivity): 14132 ng/L (ref <18)
Troponin I (High Sensitivity): 1730 ng/L (ref <18)
Troponin I (High Sensitivity): 5526 ng/L (ref <18)
Troponin I (High Sensitivity): 6038 ng/L (ref <18)
Troponin I (High Sensitivity): 7643 ng/L (ref <18)

## 2023-01-27 LAB — LIPASE, BLOOD: Lipase: 35 U/L (ref 11–51)

## 2023-01-27 LAB — APTT: aPTT: 32 seconds (ref 24–36)

## 2023-01-27 LAB — CBC WITH DIFFERENTIAL/PLATELET
Abs Immature Granulocytes: 0.05 10*3/uL (ref 0.00–0.07)
Basophils Absolute: 0.1 10*3/uL (ref 0.0–0.1)
Basophils Relative: 1 %
Eosinophils Absolute: 0.1 10*3/uL (ref 0.0–0.5)
Eosinophils Relative: 0 %
HCT: 40.4 % (ref 36.0–46.0)
Hemoglobin: 13.3 g/dL (ref 12.0–15.0)
Immature Granulocytes: 0 %
Lymphocytes Relative: 8 %
Lymphs Abs: 1.1 10*3/uL (ref 0.7–4.0)
MCH: 28.2 pg (ref 26.0–34.0)
MCHC: 32.9 g/dL (ref 30.0–36.0)
MCV: 85.6 fL (ref 80.0–100.0)
Monocytes Absolute: 0.7 10*3/uL (ref 0.1–1.0)
Monocytes Relative: 5 %
Neutro Abs: 11.7 10*3/uL — ABNORMAL HIGH (ref 1.7–7.7)
Neutrophils Relative %: 86 %
Platelets: 306 10*3/uL (ref 150–400)
RBC: 4.72 MIL/uL (ref 3.87–5.11)
RDW: 14.7 % (ref 11.5–15.5)
WBC: 13.7 10*3/uL — ABNORMAL HIGH (ref 4.0–10.5)
nRBC: 0 % (ref 0.0–0.2)

## 2023-01-27 LAB — HEPARIN LEVEL (UNFRACTIONATED)
Heparin Unfractionated: <0.1 IU/mL — ABNORMAL LOW (ref 0.30–0.70)
Heparin Unfractionated: 0.12 IU/mL — ABNORMAL LOW (ref 0.30–0.70)

## 2023-01-27 LAB — BASIC METABOLIC PANEL
Anion gap: 11 (ref 5–15)
BUN: 13 mg/dL (ref 8–23)
CO2: 24 mmol/L (ref 22–32)
Calcium: 8.7 mg/dL — ABNORMAL LOW (ref 8.9–10.3)
Chloride: 100 mmol/L (ref 98–111)
Creatinine, Ser: 0.52 mg/dL (ref 0.44–1.00)
GFR, Estimated: >60 mL/min (ref >60)
Glucose, Bld: 130 mg/dL — ABNORMAL HIGH (ref 70–99)
Potassium: 3.8 mmol/L (ref 3.5–5.1)
Sodium: 135 mmol/L (ref 135–145)

## 2023-01-27 LAB — PROTIME-INR
INR: 1.3 — ABNORMAL HIGH (ref 0.8–1.2)
Prothrombin Time: 16.4 seconds — ABNORMAL HIGH (ref 11.4–15.2)

## 2023-01-27 LAB — SARS CORONAVIRUS 2 BY RT PCR: SARS Coronavirus 2 by RT PCR: POSITIVE — AB

## 2023-01-27 MED ORDER — DILTIAZEM HCL 25 MG/5ML IV SOLN
10.0000 mg | Freq: Once | INTRAVENOUS | Status: AC
  Administered 2023-01-27: 10 mg via INTRAVENOUS
  Filled 2023-01-27: qty 5

## 2023-01-27 MED ORDER — NITROGLYCERIN IN D5W 200-5 MCG/ML-% IV SOLN
0.0000 ug/min | INTRAVENOUS | Status: DC
  Administered 2023-01-27: 20 ug/min via INTRAVENOUS
  Administered 2023-01-27: 10 ug/min via INTRAVENOUS
  Administered 2023-01-27: 15 ug/min via INTRAVENOUS
  Administered 2023-01-27: 5 ug/min via INTRAVENOUS
  Filled 2023-01-27: qty 250

## 2023-01-27 MED ORDER — HEPARIN BOLUS VIA INFUSION
3900.0000 [IU] | Freq: Once | INTRAVENOUS | Status: AC
  Administered 2023-01-27: 3900 [IU] via INTRAVENOUS
  Filled 2023-01-27: qty 3900

## 2023-01-27 MED ORDER — HYDROMORPHONE HCL 1 MG/ML IJ SOLN: 0.5000 mg | INTRAMUSCULAR | Status: AC | PRN

## 2023-01-27 MED ORDER — FAMOTIDINE 20 MG PO TABS
20.0000 mg | ORAL_TABLET | Freq: Every day | ORAL | Status: DC
  Administered 2023-01-27 – 2023-01-28 (×2): 20 mg via ORAL
  Filled 2023-01-27 (×2): qty 1

## 2023-01-27 MED ORDER — ASPIRIN 81 MG PO CHEW
81.0000 mg | CHEWABLE_TABLET | Freq: Every day | ORAL | Status: DC
  Administered 2023-01-28: 81 mg via ORAL
  Filled 2023-01-27: qty 1

## 2023-01-27 MED ORDER — HEPARIN (PORCINE) 25000 UT/250ML-% IV SOLN
1250.0000 [IU]/h | INTRAVENOUS | Status: DC
  Administered 2023-01-27: 1000 [IU]/h via INTRAVENOUS
  Administered 2023-01-27: 800 [IU]/h via INTRAVENOUS
  Filled 2023-01-27 (×2): qty 250

## 2023-01-27 MED ORDER — NALOXONE HCL 0.4 MG/ML IJ SOLN: 0.4000 mg | INTRAMUSCULAR | Status: DC | PRN

## 2023-01-27 MED ORDER — ASPIRIN 81 MG PO CHEW
324.0000 mg | CHEWABLE_TABLET | Freq: Once | ORAL | Status: AC
  Administered 2023-01-27: 324 mg via ORAL
  Filled 2023-01-27: qty 4

## 2023-01-27 MED ORDER — ISOSORBIDE MONONITRATE ER 30 MG PO TB24
30.0000 mg | ORAL_TABLET | Freq: Two times a day (BID) | ORAL | Status: DC
  Administered 2023-01-27: 30 mg via ORAL
  Filled 2023-01-27: qty 1

## 2023-01-27 MED ORDER — HYDRALAZINE HCL 20 MG/ML IJ SOLN
10.0000 mg | INTRAMUSCULAR | Status: DC | PRN
  Administered 2023-01-27: 10 mg via INTRAVENOUS
  Filled 2023-01-27: qty 1

## 2023-01-27 MED ORDER — HYDROMORPHONE HCL 1 MG/ML IJ SOLN
1.5000 mg | INTRAMUSCULAR | Status: DC | PRN
  Administered 2023-01-27: 1.5 mg via INTRAVENOUS
  Filled 2023-01-27: qty 1.5

## 2023-01-27 MED ORDER — ONDANSETRON HCL 4 MG/2ML IJ SOLN: 4.0000 mg | Freq: Three times a day (TID) | INTRAMUSCULAR | Status: DC | PRN

## 2023-01-27 MED ORDER — ACETAMINOPHEN 325 MG PO TABS
650.0000 mg | ORAL_TABLET | ORAL | Status: DC | PRN
  Administered 2023-01-27: 650 mg via ORAL
  Filled 2023-01-27 (×2): qty 2

## 2023-01-27 MED ORDER — HEPARIN BOLUS VIA INFUSION
2000.0000 [IU] | Freq: Once | INTRAVENOUS | Status: AC
  Administered 2023-01-27: 2000 [IU] via INTRAVENOUS
  Filled 2023-01-27: qty 2000

## 2023-01-27 MED ORDER — NITROGLYCERIN IN D5W 200-5 MCG/ML-% IV SOLN
0.0000 ug/min | INTRAVENOUS | Status: DC
  Administered 2023-01-27: 5 ug/min via INTRAVENOUS
  Filled 2023-01-27: qty 250

## 2023-01-27 MED ORDER — IOHEXOL 350 MG/ML SOLN
100.0000 mL | Freq: Once | INTRAVENOUS | Status: AC | PRN
  Administered 2023-01-27: 100 mL via INTRAVENOUS

## 2023-01-27 MED ORDER — NITROGLYCERIN 0.4 MG SL SUBL
0.4000 mg | SUBLINGUAL_TABLET | SUBLINGUAL | Status: DC | PRN
  Administered 2023-01-27 (×3): 0.4 mg via SUBLINGUAL
  Filled 2023-01-27: qty 1

## 2023-01-27 MED ORDER — ONDANSETRON HCL 4 MG/2ML IJ SOLN
4.0000 mg | Freq: Four times a day (QID) | INTRAMUSCULAR | Status: DC | PRN
  Administered 2023-01-27: 4 mg via INTRAVENOUS
  Filled 2023-01-27: qty 2

## 2023-01-27 MED ORDER — COLCHICINE 0.6 MG PO TABS
0.6000 mg | ORAL_TABLET | Freq: Every day | ORAL | Status: DC
  Administered 2023-01-27 – 2023-01-29 (×2): 0.6 mg via ORAL
  Filled 2023-01-27 (×3): qty 1

## 2023-01-27 NOTE — ED Notes (Signed)
Pt ambulated independently to bathroom and back to bed.

## 2023-01-27 NOTE — ED Notes (Signed)
Lab reports critical troponin results; acuity level changed and will take pt to next available exam room

## 2023-01-27 NOTE — Progress Notes (Signed)
Family requested update on plan of care in setting of NSTEMI and COVID 19  Discussed w/ pt over the phone about addition of colchicine for coverage for pericarditis/myocarditis  Still w/ mild cp  Increased dilaudid dosing  Cont NTG and heparin gtt.  Pt expressed understanding  Also conveyed communication of plan of care w/ Dr. Darrold Junker over the phone.

## 2023-01-27 NOTE — Assessment & Plan Note (Signed)
White count 13.7 on presentation Suspect likely reactive in setting of NSTEMI and COVID No hypoxia present No reported urinary symptoms in the setting history of recurrent UTIs CT angio chest negative for any overt pneumonia Will otherwise monitor and trend for now Reassess as clinically appropriate

## 2023-01-27 NOTE — ED Triage Notes (Signed)
Pt to triage via w/c with no distress noted; reports tonight having epigastric pain accomp by "indigestion and gas and elevated BP"

## 2023-01-27 NOTE — Consult Note (Signed)
[facility] CARDIOLOGY CONSULT NOTE       Patient ID: Katie Beltran MRN: [id number] DOB/AGE: 12/17/1940 82 y.o.  Admit date: 01/27/2023 Referring Physician Dr. Reinaldo Meeker Primary Physician Dr. Judithann Sheen Primary Cardiologist Dr. Juliann Pares Reason for Consultation NSTEMI  HPI: Katie Beltran is an 82yoF with a PMH of HTN, GERD, HLD, venous insufficiency, joint pain, who presented to [address] [facility] ED the early morning hours of 01/27/2023 with chest pressure and epigastric discomfort.  Troponins are elevated and trending from 1390 to current peak of 7643.  EKG sinus tach, nonspecific changes in I, aVL, and ST depression in V2.  Remains COVID-positive (initial + test on 01/01/2023).  Cardiology is consulted for further assistance.  The patient presents with her two daughters who contribute to the history.  She says that she takes her blood pressure "all the time" and yesterday evening it was severely elevated at 200/100 which was unusual for her.  Her daughter notes prior to her taking her blood pressure, she was having some substernal chest pressure, but also some epigastric discomfort that would resolve with belching, similar to her GERD symptoms.  For the past couple weeks she has taken Pepcid 20 mg every day but typically only takes this on an as-needed basis.  The patient does make the distinction that her substernal chest pressure does not resolve with belching, but is somewhat worsened with lying flat + dyspnea while lying at an incline as well.  Of note, the patient was on a cruise to [place] the first week of July and tested positive for COVID-19 on 7/6.  She had symptoms of a cough and some congestion but these have since resolved.  Denies recent fever or chills.  At my time of evaluation this morning she is sitting upright in the ED stretcher with her 2 daughters present.  She is chest pain-free but feels a little "queasy" after eating a couple bites of eggs.  She says she is very tired from being up all  night and would really like to get some rest today if possible.  She takes multiple herbal supplements and had had treatment for "inflammation" at the direction of her chiropractor as well.  In the emergency department she was given 325 mg of aspirin, and started on a nitroglycerin infusion to lower her blood pressure, which is currently much better controlled in the 130s/80s.  Also started on a heparin infusion per ACS protocol.  Troponins are elevated and trending 1390, 1730.  5526, 6038, 7643.  Leukocytosis to 13.9, BUN/creatinine within normal limits at 13/0.52 and GFR >60.  Review of systems complete and found to be negative unless listed above     Past Medical History:  Diagnosis Date   Acid reflux    Environmental allergies    Hypercholesteremia    Hypertension    Recurrent UTI     Past Surgical History:  Procedure Laterality Date   NO PAST SURGERIES     VAGINAL PROLAPSE REPAIR  2020    (Not in a hospital admission)  Social History   Socioeconomic History   Marital status: Married    Spouse name: Not on file   Number of children: Not on file   Years of education: Not on file   Highest education level: Not on file  Occupational History   Not on file  Tobacco Use   Smoking status: Never   Smokeless tobacco: Never  Substance and Sexual Activity   Alcohol use: No   Drug use: No  Sexual activity: Never    Birth control/protection: Post-menopausal  Other Topics Concern   Not on file  Social History Narrative   Not on file   Social Determinants of Health   Financial Resource Strain: Not on file  Food Insecurity: Not on file  Transportation Needs: Not on file  Physical Activity: Not on file  Stress: Not on file  Social Connections: Not on file  Intimate Partner Violence: Not on file    Family History  Problem Relation Age of Onset   Diabetes Mother    Heart disease Mother    Colon cancer Son    Diabetes Maternal Aunt    Heart disease Maternal Aunt     Heart disease Maternal Grandmother    Breast cancer Neg Hx    Ovarian cancer Neg Hx      No intake or output data in the 24 hours ending 01/27/23 0921  Vitals:   01/27/23 0850 01/27/23 0900 01/27/23 0905 01/27/23 0910  BP: (!) 140/77 136/83 138/84 133/74  Pulse: (!) 103 (!) 103 (!) 106 (!) 107  Resp: 18 (!) 28 (!) 25 19  Temp:      TempSrc:      SpO2: 94% 96% 96% 96%  Weight:      Height:        PHYSICAL EXAM General: Elderly and thin Caucasian female, well nourished, in no acute distress.  Sitting upright in ED stretcher with 2 daughters present at bedside. HEENT:  Normocephalic and atraumatic. Neck:  No JVD.  Lungs: Normal respiratory effort on room air. Clear bilaterally to auscultation. No wheezes, crackles, rhonchi.  Heart: Tachy but regular. Normal S1 and S2 without gallops or murmurs.  Abdomen: Non-distended appearing.  Msk: Normal strength and tone for age. Extremities: Warm and well perfused. No clubbing, cyanosis.  Trace bilateral lower extremity edema.  Neuro: Alert and oriented X 3. Psych:  Answers questions appropriately.   Labs: Basic Metabolic Panel: Recent Labs    01/27/23 0033 01/27/23 0820  NA 133* 135  K 3.9 3.8  CL 105 100  CO2 22 24  GLUCOSE 176* 130*  BUN 17 13  CREATININE 0.61 0.52  CALCIUM 8.6* 8.7*   Liver Function Tests: Recent Labs    01/27/23 0033  AST 30  ALT 16  ALKPHOS 65  BILITOT 0.7  PROT 7.4  ALBUMIN 3.6   Recent Labs    01/27/23 0033  LIPASE 35   CBC: Recent Labs    01/27/23 0033  WBC 13.7*  NEUTROABS 11.7*  HGB 13.3  HCT 40.4  MCV 85.6  PLT 306   Cardiac Enzymes: Recent Labs    01/27/23 0033 01/27/23 0205 01/27/23 0820  TROPONINIHS 1,390* 1,730* 5,526*   BNP: No results for input(s): "BNP" in the last 72 hours. D-Dimer: No results for input(s): "DDIMER" in the last 72 hours. Hemoglobin A1C: No results for input(s): "HGBA1C" in the last 72 hours. Fasting Lipid Panel: No results for input(s):  "CHOL", "HDL", "LDLCALC", "TRIG", "CHOLHDL", "LDLDIRECT" in the last 72 hours. Thyroid Function Tests: No results for input(s): "TSH", "T4TOTAL", "T3FREE", "THYROIDAB" in the last 72 hours.  Invalid input(s): "FREET3" Anemia Panel: No results for input(s): "VITAMINB12", "FOLATE", "FERRITIN", "TIBC", "IRON", "RETICCTPCT" in the last 72 hours.   Radiology: DG Chest Portable 1 View  Result Date: 01/27/2023 CLINICAL DATA:  Epigastric pain. EXAM: PORTABLE CHEST 1 VIEW COMPARISON:  None Available. FINDINGS: The heart size and mediastinal contours are within normal limits. There is moderate severity calcification of  the thoracic aorta. Mild, diffuse, chronic appearing increased interstitial lung markings are seen. Mild atelectatic changes are noted within the bilateral lung bases. No pleural effusion or pneumothorax is seen. The visualized skeletal structures are unremarkable. IMPRESSION: Mild bibasilar atelectasis. Electronically Signed   By: Aram Candela M.D.   On: 01/27/2023 02:41   CT Angio Chest Pulmonary Embolism (PE) W or WO Contrast  Result Date: 01/27/2023 CLINICAL DATA:  Epigastric pain. EXAM: CT ANGIOGRAPHY CHEST WITH CONTRAST TECHNIQUE: Multidetector CT imaging of the chest was performed using the standard protocol during bolus administration of intravenous contrast. Multiplanar CT image reconstructions and MIPs were obtained to evaluate the vascular anatomy. RADIATION DOSE REDUCTION: This exam was performed according to the departmental dose-optimization program which includes automated exposure control, adjustment of the mA and/or kV according to patient size and/or use of iterative reconstruction technique. CONTRAST:  OMNIPAQUE IOHEXOL 350 MG/ML SOLN COMPARISON:  January 26, 2023 FINDINGS: Cardiovascular: There is marked severity calcification of the aortic arch, without evidence of aortic aneurysm. Satisfactory opacification of the pulmonary arteries to the segmental level. No evidence  of pulmonary embolism. Normal heart size. No pericardial effusion. Mediastinum/Nodes: There is mild right hilar lymphadenopathy. The thyroid gland and trachea demonstrate no significant findings. Chronic appearing dilatation of the esophagus is seen. Lungs/Pleura: Mild atelectatic changes are seen within the posterior aspect of the bilateral upper lobes and bilateral lower lobes. No pleural effusion or pneumothorax is identified. Upper Abdomen: There is a small hiatal hernia. A 15 mm diameter focus of parenchymal low attenuation is seen within the anteromedial aspect of the right lobe of the liver. A 12 mm gallstone is seen within the lumen of an otherwise normal-appearing gallbladder. Musculoskeletal: Multilevel degenerative changes seen throughout the thoracic spine. Review of the MIP images confirms the above findings. IMPRESSION: 1. No evidence of pulmonary embolism. 2. Mild bilateral upper lobe and bilateral lower lobe atelectasis. 3. Small hiatal hernia. 4. Cholelithiasis. 5. Aortic atherosclerosis. Aortic Atherosclerosis (ICD10-I70.0). Electronically Signed   By: Aram Candela M.D.   On: 01/27/2023 02:27   CT CARDIAC SCORING (SELF PAY ONLY)  Result Date: 01/26/2023 CLINICAL DATA:  Risk stratification EXAM: Coronary Calcium Score TECHNIQUE: The patient was scanned on a Siemens Somatom scanner. Axial non-contrast 3 mm slices were carried out through the heart. The data set was analyzed on a dedicated work station and scored using the Agatson method. FINDINGS: Non-cardiac: See separate report from [facility] Radiology. Ascending Aorta: Normal size, aortic atherosclerosis Pericardium: Normal Coronary arteries: Normal origin of left and right coronary arteries. Distribution of arterial calcifications if present, as noted below; LM 0 LAD 15 LCx 0 RCA 0 Total 15 IMPRESSION AND RECOMMENDATION: 1. Coronary calcium score of 15. This was 16th percentile for age and sex matched control. 2. CAC 1-99 in LAD.  CAC-DRS A1/N1 3. Continue heart healthy lifestyle and risk factor modification. Electronically Signed   By: Debbe Odea M.D.   On: 01/26/2023 18:04    ECHO 07/09/2016 INTERPRETATION  NORMAL LEFT VENTRICULAR SYSTOLIC FUNCTION   WITH MILD LVH  NORMAL RIGHT VENTRICULAR SYSTOLIC FUNCTION  MILD VALVULAR REGURGITATION (See above)  NO VALVULAR STENOSIS  EF >55%   ______________________________________________________________________  Electronically signed by: MD Arnoldo Hooker on 07/11/2016 07:43 AM              Performed By: Merla Riches, RDCS        Ordering Physician: Arnoldo Hooker    Treadmill myoview - 07/09/2016 Impression  Normal treadmill EKG without evidence  of ischemia or arrhythmia Normal myocardial perfusion without evidence of myocardial ischemia  Arnoldo Hooker Narrative  This result has an attachment that is not available. CARDIOLOGY DEPARTMENT [facility] [address] [place], [place]  [address] [phone]  Procedure: Exercise Myocardial Perfusion Imaging   ONE day procedure  Indication: SOBOE (shortness of breath on exertion) Plan: NM myocardial perfusion SPECT multiple (stress       and rest), ECG stress test only  Ordering Physician:  Dr. Arnoldo Hooker   Clinical History: 82 y.o. 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 The exercise test was stopped due to fatigue.  Blood pressure response was normal. The patient developed symptoms other than fatigue during the procedure; specific symptoms included SOB  Rest HR: 80bpm Rest BP: 140/62mmHg Max HR: 151bpm Max BP: 162/71mmHg Mets:     7.00 % MAX HR:   104%  Stress Test Administered by: Percell Boston, CMA  TELEMETRY reviewed by me (LT) 01/27/2023 : Sinus tachycardia rate low  100s.  EKG reviewed by me: KG showing sinus tach, nonspecific changes in I, aVL, and ST depression in V2.  Data reviewed by me (LT) 01/27/2023: Last outpatient cardiology note, ED note, admission H&P last 24h vitals tele labs imaging I/O   Principal Problem:   NSTEMI (non-ST elevated myocardial infarction) [facility])    ASSESSMENT AND PLAN:  Katie Beltran is an 82yoF with a PMH of HTN, GERD, HLD, venous insufficiency, joint pain, who presented to [facility] ED the early morning hours of 01/27/2023 with chest pressure and epigastric discomfort.  Troponins are elevated and trending from 1390 to current peak of 7643.  EKG showing sinus tach, nonspecific changes in I, aVL, and ST depression in V2.  Remains COVID-positive (initial + test on 01/01/2023).  Cardiology is consulted for further assistance.  # NSTEMI # COVID-19 Differential diagnosis includes ACS vs postviral myo/pericarditis.  Some element of positional chest discomfort (better with sitting upright) but also characteristics of substernal pressure relieved with sublingual nitroglycerin.  The patient is hesitant to undergo invasive cardiac diagnostics on discussion today (LHC).  -S/p 325 mg aspirin, continue 81 mg aspirin daily -Continue heparin drip for now, to complete 48 hours/or until potential LHC. -Trend troponin until peak. -Intolerant of BB (throat irritation, vomiting, cough with propranolol) -Intolerant of ACE/ARB -Intolerant of statins (myalgias) -Increase home isosorbide to 30 mg twice daily (home dose 15 twice daily) -As needed SL nitroglycerin, Dilaudid for current chest pain -Continue famotidine 20 mg daily for GERD -Echo complete, further recommendations based on its results.  This patient's plan of care was discussed and created with Dr. Darrold Junker and he is in agreement.  Signed: Rebeca Allegra , PA-C 01/27/2023, 9:21 AM [facility] Cardiology

## 2023-01-27 NOTE — Progress Notes (Signed)
Patient arrived on floor.  During initial assessment, patient complains of 6 out of 10 chest pressure (mid-sternal). Vitals taken and MD made aware.    Per cardiologist- give nitroglycerin SL.    nitroglycerin given x 3- 5 minutes apart.   BP sustained in the 120s/80s.   After 3rd dose- patient reports pain 0 out of 10.   Patient did endorse pain in chest area with palpation while Echo tech is in room.   MD made aware.

## 2023-01-27 NOTE — H&P (Signed)
History and Physical    Patient: Katie Beltran JYN:[id number] DOB: 09-Jul-1940 DOA: 01/27/2023 DOS: the patient was seen and examined on 01/27/2023 PCP: Marguarite Arbour, MD  Patient coming from: Home  Chief Complaint:  Chief Complaint  Patient presents with   Abdominal Pain   HPI: Katie Beltran is a 82 y.o. female with medical history significant of hypertension, GERD, hyperlipidemia, recurrent UTI, coronary artery disease presenting with NSTEMI, hypertensive urgency, COVID-19.  Patient reported generalized malaise over the past 24 hours.  Symptoms predominately happened overnight.  Patient states she regularly checks her blood pressure.  Blood pressure was in the 200s over 100s at home.  Took her home dose of Imdur which was recently prescribed by Dr. Juliann Pares.  Blood pressure mildly improved at home.  Positive generalized chest pain nausea and malaise.  Noted to have been recently evaluated by Dr. Juliann Pares for coronary artery disease.  Had a coronary CT done yesterday which showed a calcium score over 15.  There was some discussion about further evaluation going forward.  No shortness of breath.  Mild nausea.  Patient does report diagnosis of COVID while patient was in [place] 3 weeks ago.  No cough.  No diarrhea. Presented to the ER afebrile, heart rate 100s, blood pressures 150s to 190s over 90s to 100s.  Troponin 1390-->5526.  White count 13.7, hemoglobin 13.3, creatinine 0.6.  COVID-positive.  CT angio chest negative for any PE, mild bilateral upper lobe and bilateral lobe atelectasis.  Cholelithiasis. Review of Systems: As mentioned in the history of present illness. All other systems reviewed and are negative. Past Medical History:  Diagnosis Date   Acid reflux    Environmental allergies    Hypercholesteremia    Hypertension    Recurrent UTI    Past Surgical History:  Procedure Laterality Date   NO PAST SURGERIES     VAGINAL PROLAPSE REPAIR  2020   Social History:  reports that she  has never smoked. She has never used smokeless tobacco. She reports that she does not drink alcohol and does not use drugs.  Allergies  Allergen Reactions   Propranolol Other (See Comments)    Vomiting, cough, throat irritation    Amlodipine Swelling   Ciprofloxacin Other (See Comments)    Chest tightness   Irbesartan Other (See Comments)   Lisinopril Other (See Comments)   Omeprazole Other (See Comments)    Leg pain and burning   Pantoprazole Other (See Comments)    Leg pain and burning   Symbicort [Budesonide-Formoterol Fumarate]     Chills fever   Doxycycline Rash   Prednisone Rash    Family History  Problem Relation Age of Onset   Diabetes Mother    Heart disease Mother    Colon cancer Son    Diabetes Maternal Aunt    Heart disease Maternal Aunt    Heart disease Maternal Grandmother    Breast cancer Neg Hx    Ovarian cancer Neg Hx     Prior to Admission medications   Medication Sig Start Date End Date Taking? Authorizing Provider  ezetimibe (ZETIA) 10 MG tablet Take 10 mg by mouth daily.   Yes [provider]  famotidine (PEPCID) 20 MG tablet Take 20 mg by mouth 2 (two) times daily.   Yes [provider]  isosorbide mononitrate (IMDUR) 30 MG 24 hr tablet Take 15 mg by mouth 2 (two) times daily. 12/19/22  Yes Marguarite Arbour, MD  loratadine (CLARITIN) 10 MG tablet Take  10 mg by mouth daily.   Yes [provider]  Multiple Vitamin (MULTI-VITAMIN) tablet Take 1 tablet by mouth daily.   Yes [provider]  zinc gluconate 50 MG tablet Take 50 mg by mouth daily.   Yes [provider]  isosorbide dinitrate (ISORDIL) 30 MG tablet Take 30 mg by mouth 4 (four) times daily. Patient not taking: Reported on 01/27/2023    [provider]    Physical Exam: Vitals:   01/27/23 0900 01/27/23 0905 01/27/23 0910 01/27/23 0930  BP: 136/83 138/84 133/74 128/82  Pulse: (!) 103 (!) 106 (!) 107 (!) 101  Resp: (!) 28 (!) 25 19 20    Temp:      TempSrc:      SpO2: 96% 96% 96% 98%  Weight:      Height:       Physical Exam Constitutional:      Appearance: She is normal weight.  HENT:     Head: Normocephalic and atraumatic.     Nose: Nose normal.     Mouth/Throat:     Mouth: Mucous membranes are moist.  Eyes:     Pupils: Pupils are equal, round, and reactive to light.  Cardiovascular:     Rate and Rhythm: Normal rate and regular rhythm.  Pulmonary:     Effort: Pulmonary effort is normal.  Abdominal:     General: Bowel sounds are normal.  Musculoskeletal:        General: Normal range of motion.     Cervical back: Normal range of motion.  Skin:    General: Skin is warm.  Neurological:     General: No focal deficit present.  Psychiatric:        Mood and Affect: Mood normal.     Data Reviewed:  There are no new results to review at this time. DG Chest Portable 1 View CLINICAL DATA:  Epigastric pain.  EXAM: PORTABLE CHEST 1 VIEW  COMPARISON:  None Available.  FINDINGS: The heart size and mediastinal contours are within normal limits. There is moderate severity calcification of the thoracic aorta. Mild, diffuse, chronic appearing increased interstitial lung markings are seen. Mild atelectatic changes are noted within the bilateral lung bases. No pleural effusion or pneumothorax is seen. The visualized skeletal structures are unremarkable.  IMPRESSION: Mild bibasilar atelectasis.  Electronically Signed   By: Aram Candela M.D.   On: 01/27/2023 02:41 CT Angio Chest Pulmonary Embolism (PE) W or WO Contrast CLINICAL DATA:  Epigastric pain.  EXAM: CT ANGIOGRAPHY CHEST WITH CONTRAST  TECHNIQUE: Multidetector CT imaging of the chest was performed using the standard protocol during bolus administration of intravenous contrast. Multiplanar CT image reconstructions and MIPs were obtained to evaluate the vascular anatomy.  RADIATION DOSE REDUCTION: This exam was performed according to  the departmental dose-optimization program which includes automated exposure control, adjustment of the mA and/or kV according to patient size and/or use of iterative reconstruction technique.  CONTRAST:  OMNIPAQUE IOHEXOL 350 MG/ML SOLN  COMPARISON:  January 26, 2023  FINDINGS: Cardiovascular: There is marked severity calcification of the aortic arch, without evidence of aortic aneurysm. Satisfactory opacification of the pulmonary arteries to the segmental level. No evidence of pulmonary embolism. Normal heart size. No pericardial effusion.  Mediastinum/Nodes: There is mild right hilar lymphadenopathy. The thyroid gland and trachea demonstrate no significant findings. Chronic appearing dilatation of the esophagus is seen.  Lungs/Pleura: Mild atelectatic changes are seen within the posterior aspect of the bilateral upper lobes and bilateral lower  lobes.  No pleural effusion or pneumothorax is identified.  Upper Abdomen: There is a small hiatal hernia.  A 15 mm diameter focus of parenchymal low attenuation is seen within the anteromedial aspect of the right lobe of the liver.  A 12 mm gallstone is seen within the lumen of an otherwise normal-appearing gallbladder.  Musculoskeletal: Multilevel degenerative changes seen throughout the thoracic spine.  Review of the MIP images confirms the above findings.  IMPRESSION: 1. No evidence of pulmonary embolism. 2. Mild bilateral upper lobe and bilateral lower lobe atelectasis. 3. Small hiatal hernia. 4. Cholelithiasis. 5. Aortic atherosclerosis.  Aortic Atherosclerosis (ICD10-I70.0).  Electronically Signed   By: Aram Candela M.D.   On: 01/27/2023 02:27  Lab Results  Component Value Date   WBC 13.7 (H) 01/27/2023   HGB 13.3 01/27/2023   HCT 40.4 01/27/2023   MCV 85.6 01/27/2023   PLT 306 01/27/2023   Last metabolic panel Lab Results  Component Value Date   GLUCOSE 130 (H) 01/27/2023   NA 135 01/27/2023   K  3.8 01/27/2023   CL 100 01/27/2023   CO2 24 01/27/2023   BUN 13 01/27/2023   CREATININE 0.52 01/27/2023   GFRNONAA >60 01/27/2023   CALCIUM 8.7 (L) 01/27/2023   PROT 7.4 01/27/2023   ALBUMIN 3.6 01/27/2023   BILITOT 0.7 01/27/2023   ALKPHOS 65 01/27/2023   AST 30 01/27/2023   ALT 16 01/27/2023   ANIONGAP 11 01/27/2023    Assessment and Plan: * NSTEMI (non-ST elevated myocardial infarction) (HCC) Positive chest pain and generalized malaise as well as hypertensive urgency Noted recent evaluation with Dr. Juliann Pares and coronary CT with calcium score 15 Troponin 1300s-->5500 EKG sinus tachycardia  HEART score >6 Status post full dose aspirin Started on heparin and nitroglycerin drip in the ER 2D echo Anticipate cardiac catheterization Follow-up formal cardiology recommendations  Leukocytosis White count 13.7 on presentation Suspect likely reactive in setting of NSTEMI and COVID No hypoxia present No reported urinary symptoms in the setting history of recurrent UTIs CT angio chest negative for any overt pneumonia Will otherwise monitor and trend for now Reassess as clinically appropriate  COVID-19 virus infection Noted formal COVID-19 diagnosis relatively 3 to 4 weeks ago with associated trip to [place] per patient COVID-19 positive today in the setting of NSTEMI CT imaging negative for any cardiac inflammation  No hypoxia at present Will otherwise symptomatically manage for now   Hypertensive urgency Systolic pressures into the 200s over 100s with associated NSTEMI Started on nitroglycerin drip As needed IV hydralazine for systolic pressures greater than 175 or diastolic pressures greater than 110 Follow-up formal cardiology recommendations  Pure hypercholesterolemia Zetia  Noted statin intolerance   GERD without esophagitis PPI       Advance Care Planning:   Code Status: Full Code   Consults: Cardiology   Family Communication: Family at the bedside    Severity of Illness: The appropriate patient status for this patient is INPATIENT. Inpatient status is judged to be reasonable and necessary in order to provide the required intensity of service to ensure the patient's safety. The patient's presenting symptoms, physical exam findings, and initial radiographic and laboratory data in the context of their chronic comorbidities is felt to place them at high risk for further clinical deterioration. Furthermore, it is not anticipated that the patient will be medically stable for discharge from the hospital within 2 midnights of admission.   * I certify that at the point of admission it is my clinical  judgment that the patient will require inpatient hospital care spanning beyond 2 midnights from the point of admission due to high intensity of service, high risk for further deterioration and high frequency of surveillance required.*  Author: Floydene Flock, MD 01/27/2023 9:45 AM  For on call review [email or web address].

## 2023-01-27 NOTE — Progress Notes (Addendum)
Pt is on nitrgolycerin drip 15 mcg but still complaining of 8/10 pressure and tightness on the mid CP. Nitroglycerin drip was increased from 15 mcg/min to 20 mcg/min. PRN 1.5 mg IV Dilaudid was also administered. After 3 minutes pt starts to complained of dizzness and BP starts to dropped at 100/64 MAP 76 HR 93. BP Previously as at 147/90 MAP 106 HR 108. NP Jon Billings made aware. MD Parachos made aware at 0112. Will continue to monitor  Update 2313: See new orders. Will continue to monitor.  Update 0013: Pt troponin is at 14,132 at 2256 from 11,651. Pt having QTC > 500 and went up to 560 but after dilaudid administration pt was pain free and QTC is down at 498. NP Jon Billings made aware but no new order place. Will continue to monitor.  Update 0248: Paged MD Parachos. Spoked with MD Parachos about trops 14,132 and QTC result but no new order place. Will continue to monitor.  Update 0337: See new orders. Will continue to monitor.  Update 0557: Pt troponin at 15,765 but no complaints of CP. NP Jon Billings made aware. Will continue to monitor.

## 2023-01-27 NOTE — ED Notes (Signed)
Patient responding to NTG and hydralazine; will continue to repeat BP checks q23mins.

## 2023-01-27 NOTE — Plan of Care (Signed)

## 2023-01-27 NOTE — Progress Notes (Signed)
*  PRELIMINARY RESULTS* Echocardiogram 2D Echocardiogram has been performed.  Cristela Blue 01/27/2023, 3:02 PM

## 2023-01-27 NOTE — Progress Notes (Signed)
Patient complaining of 7/10 chest pain.  All vitals are stable.  Patient describes the pain to be worse when taking a deep breath and is not constant.  EKG obtained.  Cardiology paged and gave verbal order to start a nitroglycerin gtt and add in colchicine daily.

## 2023-01-27 NOTE — Assessment & Plan Note (Addendum)
Positive chest pain and generalized malaise as well as hypertensive urgency Noted recent evaluation with Dr. Juliann Pares and coronary CT with calcium score 15 Troponin 1300s-->5500 EKG sinus tachycardia  HEART score >6 Status post full dose aspirin Started on heparin and nitroglycerin drip in the ER 2D echo Anticipate cardiac catheterization Follow-up formal cardiology recommendations

## 2023-01-27 NOTE — Assessment & Plan Note (Signed)
Systolic pressures into the 200s over 100s with associated NSTEMI Started on nitroglycerin drip As needed IV hydralazine for systolic pressures greater than 175 or diastolic pressures greater than 110 Follow-up formal cardiology recommendations

## 2023-01-27 NOTE — ED Provider Notes (Signed)
[facility] Provider Note    Event Date/Time   First MD Initiated Contact with Patient 01/27/23 0147     (approximate)   History   Abdominal Pain   HPI  Katie Beltran is a 82 y.o. female  here with shortness of breath, chest discomfort. Pt reports that over hte past day, she has had indigestion and dull substernal chest pressure, which she thought was 2/2 reflux from eating. However, she has also had some SOB and belching. Feels a pressure in her chest. Of note, she just had a coronary CT scan yesterday after being evaluated for some intermittent shoulder/back pain and swellin gin her hands, with elevated cholesterol levels. Sees Dr. Juliann Pares but this had been ordered by PCP. No known ho heart failure. No new medications. Of note, she just travelled to [place] for a cruise and was diagnosed with COVID-19.       Physical Exam   Triage Vital Signs: ED Triage Vitals  Encounter Vitals Group     BP 01/27/23 0032 (!) 175/110     Systolic BP Percentile --      Diastolic BP Percentile --      Pulse Rate 01/27/23 0032 (!) 116     Resp 01/27/23 0032 20     Temp 01/27/23 0032 (!) 97.5 F (36.4 C)     Temp Source 01/27/23 0032 Oral     SpO2 01/27/23 0032 98 %     Weight 01/27/23 0031 157 lb (71.2 kg)     Height 01/27/23 0031 5\' 2"  (1.575 m)     Head Circumference --      Peak Flow --      Pain Score 01/27/23 0031 6     Pain Loc --      Pain Education --      Exclude from Growth Chart --     Most recent vital signs: Vitals:   01/27/23 0845 01/27/23 0850  BP: (!) 141/86 (!) 140/77  Pulse: (!) 105 (!) 103  Resp: 17 18  Temp:    SpO2: 95% 94%     General: Awake, no distress.  CV:  Good peripheral perfusion. RRR. Resp:  Normal work of breathing. Lungs clear bilaterally. Abd:  No distention. No tenderness. No rebound or guarding. No epigastric TTP. Other:  MMM. No leg swelling.   ED Results / Procedures / Treatments   Labs (all labs ordered are  listed, but only abnormal results are displayed) Labs Reviewed  SARS CORONAVIRUS 2 BY RT PCR - Abnormal; Notable for the following components:      Result Value   SARS Coronavirus 2 by RT PCR POSITIVE (*)    All other components within normal limits  CBC WITH DIFFERENTIAL/PLATELET - Abnormal; Notable for the following components:   WBC 13.7 (*)    Neutro Abs 11.7 (*)    All other components within normal limits  COMPREHENSIVE METABOLIC PANEL - Abnormal; Notable for the following components:   Sodium 133 (*)    Glucose, Bld 176 (*)    Calcium 8.6 (*)    All other components within normal limits  PROTIME-INR - Abnormal; Notable for the following components:   Prothrombin Time 16.4 (*)    INR 1.3 (*)    All other components within normal limits  BASIC METABOLIC PANEL - Abnormal; Notable for the following components:   Glucose, Bld 130 (*)    Calcium 8.7 (*)    All other components within normal limits  TROPONIN I (HIGH SENSITIVITY) - Abnormal; Notable for the following components:   Troponin I (High Sensitivity) 1,390 (*)    All other components within normal limits  TROPONIN I (HIGH SENSITIVITY) - Abnormal; Notable for the following components:   Troponin I (High Sensitivity) 1,730 (*)    All other components within normal limits  TROPONIN I (HIGH SENSITIVITY) - Abnormal; Notable for the following components:   Troponin I (High Sensitivity) 5,526 (*)    All other components within normal limits  LIPASE, BLOOD  APTT  HEPARIN LEVEL (UNFRACTIONATED)     EKG Sinus tachycardia, ventricular rate 114.  PR 167, QRS 160 QTc 47.  Nonspecific ST change.  No acute ST elevations or depressions.   RADIOLOGY Chest x-ray: Mild bibasilar atelectasis CT angio chest: No evidence of PE, mild atelectasis, small hiatal hernia   I also independently reviewed and agree with radiologist interpretations.   PROCEDURES:  Critical Care performed: Yes, see critical care procedure  note(s)  .Critical Care  Performed by: Shaune Pollack, MD Authorized by: Shaune Pollack, MD   Critical care provider statement:    Critical care time (minutes):  30   Critical care time was exclusive of:  Separately billable procedures and treating other patients   Critical care was necessary to treat or prevent imminent or life-threatening deterioration of the following conditions:  Circulatory failure, cardiac failure and respiratory failure   Critical care was time spent personally by me on the following activities:  Development of treatment plan with patient or surrogate, discussions with consultants, evaluation of patient's response to treatment, examination of patient, ordering and review of laboratory studies, ordering and review of radiographic studies, ordering and performing treatments and interventions, pulse oximetry, re-evaluation of patient's condition and review of old charts     MEDICATIONS ORDERED IN ED: Medications  nitroGLYCERIN 50 mg in dextrose 5 % 250 mL (0.2 mg/mL) infusion (15 mcg/min Intravenous Rate/Dose Change 01/27/23 0749)  heparin ADULT infusion 100 units/mL (25000 units/270mL) (800 Units/hr Intravenous New Bag/Given 01/27/23 0355)  HYDROmorphone (DILAUDID) injection 0.5 mg (has no administration in time range)  ondansetron (ZOFRAN) injection 4 mg (has no administration in time range)  nitroGLYCERIN (NITROSTAT) SL tablet 0.4 mg (has no administration in time range)  hydrALAZINE (APRESOLINE) injection 10 mg (10 mg Intravenous Given 01/27/23 0815)  iohexol (OMNIPAQUE) 350 MG/ML injection 100 mL (100 mLs Intravenous Contrast Given 01/27/23 0208)  diltiazem (CARDIZEM) injection 10 mg (10 mg Intravenous Given 01/27/23 0250)  heparin bolus via infusion 3,900 Units (3,900 Units Intravenous Bolus from Bag 01/27/23 0355)  aspirin chewable tablet 324 mg (324 mg Oral Given 01/27/23 0355)     IMPRESSION / MDM / ASSESSMENT AND PLAN / ED COURSE  I reviewed the triage vital signs  and the nursing notes.                              Differential diagnosis includes, but is not limited to, NSTEMI, COVID myocarditis, PE, SCAD, CHF, HTN emergency  Patient's presentation is most consistent with acute presentation with potential threat to life or bodily function.  The patient is on the cardiac monitor to evaluate for evidence of arrhythmia and/or significant heart rate changes  82 yo F here with "indigestion" and chest discomfort. Initial trop >1000. Concern for possible NSTEMI vs HTN emergency vs COVID-related myocarditis. No ST elevations on EKG. BP improved with dilt here (multiple drug allergies). Of note, pt had minimal CAD on  recent CT scan, though this does not rule out acute coronary disease. Asa, heparin, and admit. Dr. Darrold Junker consulted.     FINAL CLINICAL IMPRESSION(S) / ED DIAGNOSES   Final diagnoses:  NSTEMI (non-ST elevated myocardial infarction) (HCC)     Rx / DC Orders   ED Discharge Orders     None        Note:  This document was prepared using Dragon voice recognition software and may include unintentional dictation errors.   Shaune Pollack, MD 01/27/23 [phone]

## 2023-01-27 NOTE — ED Notes (Signed)
Breakfast tray given to pt 

## 2023-01-27 NOTE — Assessment & Plan Note (Signed)
Noted formal COVID-19 diagnosis relatively 3 to 4 weeks ago with associated trip to [place] per patient COVID-19 positive today in the setting of NSTEMI CT imaging negative for any cardiac inflammation  No hypoxia at present Will otherwise symptomatically manage for now

## 2023-01-27 NOTE — ED Notes (Signed)
Patient denies pain and is resting comfortably.  

## 2023-01-27 NOTE — Assessment & Plan Note (Signed)
Zetia  Noted statin intolerance

## 2023-01-27 NOTE — Progress Notes (Signed)
ANTICOAGULATION CONSULT NOTE  Pharmacy Consult for Heparin  Indication: chest pain/ACS  Allergies  Allergen Reactions   Propranolol Other (See Comments)    Vomiting, cough, throat irritation    Amlodipine Swelling   Ciprofloxacin Other (See Comments)    Chest tightness   Irbesartan Other (See Comments)   Lisinopril Other (See Comments)   Omeprazole Other (See Comments)    Leg pain and burning   Pantoprazole Other (See Comments)    Leg pain and burning   Symbicort [Budesonide-Formoterol Fumarate]     Chills fever   Doxycycline Rash   Prednisone Rash    Patient Measurements: Height: 5\' 2"  (157.5 cm) Weight: 71.2 kg (157 lb) IBW/kg (Calculated) : 50.1 Heparin Dosing Weight: 65.2 kg   Vital Signs: Temp: 97.5 F (36.4 C) (08/01 1608) Temp Source: Oral (08/01 1359) BP: 138/88 (08/01 1608) Pulse Rate: 100 (08/01 1608)  Labs: Recent Labs    01/27/23 0033 01/27/23 0205 01/27/23 0315 01/27/23 0820 01/27/23 1023 01/27/23 1201 01/27/23 1340 01/27/23 1538 01/27/23 1854  HGB 13.3  --   --  13.3  --   --   --   --   --   HCT 40.4  --   --  40.9  --   --   --   --   --   PLT 306  --   --  315  --   --   --   --   --   APTT  --   --  32  --   --   --   --   --   --   LABPROT  --   --  16.4*  --   --   --   --   --   --   INR  --   --  1.3*  --   --   --   --   --   --   HEPARINUNFRC  --   --   --   --   --  <0.10*  --   --  0.12*  CREATININE 0.61  --   --  0.52  --   --   --   --   --   TROPONINIHS 1,390*   < >  --  5,526* 6,038*  --  7,643* 11,072*  --    < > = values in this interval not displayed.    Estimated Creatinine Clearance: 50.1 mL/min (by C-G formula based on SCr of 0.52 mg/dL).   Medical History: Past Medical History:  Diagnosis Date   Acid reflux    Environmental allergies    Hypercholesteremia    Hypertension    Recurrent UTI     Medications:  Medications Prior to Admission  Medication Sig Dispense Refill Last Dose   ezetimibe (ZETIA) 10 MG  tablet Take 10 mg by mouth daily.   Past Week   famotidine (PEPCID) 20 MG tablet Take 20 mg by mouth 2 (two) times daily.   01/26/2023 at am   isosorbide mononitrate (IMDUR) 30 MG 24 hr tablet Take 15 mg by mouth 2 (two) times daily.   01/26/2023 at 2130   loratadine (CLARITIN) 10 MG tablet Take 10 mg by mouth daily.   01/26/2023   Multiple Vitamin (MULTI-VITAMIN) tablet Take 1 tablet by mouth daily.   01/26/2023   zinc gluconate 50 MG tablet Take 50 mg by mouth daily.   Past Month   isosorbide dinitrate (ISORDIL) 30 MG tablet Take  30 mg by mouth 4 (four) times daily. (Patient not taking: Reported on 01/27/2023)   Not Taking    Assessment: Pharmacy consulted to dose heparin in this 82 year old female admitted with ACS/NSTEMI.  No prior anticoag noted. CrCl = 50.1 ml/min  Goal of Therapy:  HL : 0.3 - 0.7  Monitor platelets by anticoagulation protocol: Yes   08/01 1854 HL 0.12, subtherapeutic  Plan:  Bolus 2000 units x 1 Increase heparin infusion to 1000 units/hr Recheck HL in 8 hr after rate change. CBC daily while on heparin infusion.  Otelia Sergeant, PharmD, [facility] 01/27/2023 7:27 PM

## 2023-01-27 NOTE — Assessment & Plan Note (Signed)
PPI ?

## 2023-01-27 NOTE — Progress Notes (Signed)
ANTICOAGULATION CONSULT NOTE - Initial Consult  Pharmacy Consult for Heparin  Indication: chest pain/ACS  Allergies  Allergen Reactions   Propranolol Other (See Comments)    Vomiting, cough, throat irritation    Amlodipine Swelling   Ciprofloxacin Other (See Comments)    Chest tightness   Irbesartan Other (See Comments)   Lisinopril Other (See Comments)   Omeprazole Other (See Comments)    Leg pain and burning   Pantoprazole Other (See Comments)    Leg pain and burning   Symbicort [Budesonide-Formoterol Fumarate]     Chills fever   Doxycycline Rash   Prednisone Rash    Patient Measurements: Height: 5\' 2"  (157.5 cm) Weight: 71.2 kg (157 lb) IBW/kg (Calculated) : 50.1 Heparin Dosing Weight: 65.2 kg   Vital Signs: Temp: 97.6 F (36.4 C) (08/01 0136) Temp Source: Oral (08/01 0032) BP: 162/103 (08/01 0230) Pulse Rate: 110 (08/01 0230)  Labs: Recent Labs    01/27/23 0033  HGB 13.3  HCT 40.4  PLT 306  CREATININE 0.61  TROPONINIHS 1,390*    Estimated Creatinine Clearance: 50.1 mL/min (by C-G formula based on SCr of 0.61 mg/dL).   Medical History: Past Medical History:  Diagnosis Date   Acid reflux    Environmental allergies    Hypercholesteremia    Hypertension    Recurrent UTI     Medications:  (Not in a hospital admission)   Assessment: Pharmacy consulted to dose heparin in this 82 year old female admitted with ACS/NSTEMI.  No prior anticoag noted. CrCl = 50.1 ml/min  Goal of Therapy:  HL : 0.3 - 0.7  Monitor platelets by anticoagulation protocol: Yes   Plan:  Give 3900 units bolus x 1 Start heparin infusion at 800 units/hr Check anti-Xa level in 8 hours and daily while on heparin Continue to monitor H&H and platelets  Anysha Frappier D 01/27/2023,2:56 AM

## 2023-01-28 ENCOUNTER — Encounter
Admission: EM | Disposition: A | Discharge status: Home / Self Care | Payer: Self-pay | Source: Home / Self Care | Attending: Internal Medicine

## 2023-01-28 ENCOUNTER — Other Ambulatory Visit (HOSPITAL_COMMUNITY): Payer: Self-pay

## 2023-01-28 DIAGNOSIS — I214 Non-ST elevation (NSTEMI) myocardial infarction: Secondary | ICD-10-CM | POA: Diagnosis not present

## 2023-01-28 HISTORY — PX: CORONARY STENT INTERVENTION: CATH118234

## 2023-01-28 HISTORY — PX: LEFT HEART CATH AND CORONARY ANGIOGRAPHY: CATH118249

## 2023-01-28 LAB — URINALYSIS, COMPLETE (UACMP) WITH MICROSCOPIC
Bilirubin Urine: NEGATIVE
Glucose, UA: NEGATIVE mg/dL
Ketones, ur: 5 mg/dL — AB
Nitrite: POSITIVE — AB
Protein, ur: >=300 mg/dL — AB
Specific Gravity, Urine: 1.024 (ref 1.005–1.030)
WBC, UA: >50 WBC/hpf (ref 0–5)
pH: 5 (ref 5.0–8.0)

## 2023-01-28 LAB — HEPARIN LEVEL (UNFRACTIONATED): Heparin Unfractionated: 0.36 IU/mL (ref 0.30–0.70)

## 2023-01-28 LAB — POCT ACTIVATED CLOTTING TIME
Activated Clotting Time: 354 seconds
Activated Clotting Time: 262 seconds

## 2023-01-28 LAB — PHOSPHORUS: Phosphorus: 3 mg/dL (ref 2.5–4.6)

## 2023-01-28 LAB — MAGNESIUM: Magnesium: 1.7 mg/dL (ref 1.7–2.4)

## 2023-01-28 LAB — TROPONIN I (HIGH SENSITIVITY): Troponin I (High Sensitivity): 15765 ng/L (ref <18)

## 2023-01-28 SURGERY — LEFT HEART CATH AND CORONARY ANGIOGRAPHY
Anesthesia: Moderate Sedation

## 2023-01-28 MED ORDER — LIDOCAINE HCL 1 % IJ SOLN
INTRAMUSCULAR | Status: AC
  Filled 2023-01-28: qty 20

## 2023-01-28 MED ORDER — IOHEXOL 300 MG/ML  SOLN
INTRAMUSCULAR | Status: DC | PRN
  Administered 2023-01-28: 200 mL

## 2023-01-28 MED ORDER — HEPARIN (PORCINE) IN NACL 2000-0.9 UNIT/L-% IV SOLN
INTRAVENOUS | Status: DC | PRN
  Administered 2023-01-28: 1000 mL

## 2023-01-28 MED ORDER — ONDANSETRON HCL 4 MG/2ML IJ SOLN: 4.0000 mg | Freq: Four times a day (QID) | INTRAMUSCULAR | Status: DC | PRN

## 2023-01-28 MED ORDER — METOPROLOL SUCCINATE ER 25 MG PO TB24
25.0000 mg | ORAL_TABLET | Freq: Every day | ORAL | Status: DC
  Filled 2023-01-28: qty 1

## 2023-01-28 MED ORDER — ASPIRIN 81 MG PO CHEW
CHEWABLE_TABLET | ORAL | Status: AC
  Filled 2023-01-28: qty 3

## 2023-01-28 MED ORDER — MIDAZOLAM HCL 2 MG/2ML IJ SOLN
INTRAMUSCULAR | Status: DC | PRN
  Administered 2023-01-28: .5 mg via INTRAVENOUS

## 2023-01-28 MED ORDER — HYDRALAZINE HCL 20 MG/ML IJ SOLN: 10.0000 mg | INTRAMUSCULAR | Status: AC | PRN

## 2023-01-28 MED ORDER — FENTANYL CITRATE (PF) 100 MCG/2ML IJ SOLN
INTRAMUSCULAR | Status: AC
  Filled 2023-01-28: qty 2

## 2023-01-28 MED ORDER — HEPARIN (PORCINE) IN NACL 1000-0.9 UT/500ML-% IV SOLN
INTRAVENOUS | Status: AC
  Filled 2023-01-28: qty 1000

## 2023-01-28 MED ORDER — ASPIRIN 81 MG PO CHEW: 81.0000 mg | CHEWABLE_TABLET | ORAL | Status: DC

## 2023-01-28 MED ORDER — VERAPAMIL HCL 2.5 MG/ML IV SOLN
INTRAVENOUS | Status: AC
  Filled 2023-01-28: qty 2

## 2023-01-28 MED ORDER — SODIUM CHLORIDE 0.9% FLUSH: 3.0000 mL | INTRAVENOUS | Status: DC | PRN

## 2023-01-28 MED ORDER — CLOPIDOGREL BISULFATE 300 MG PO TABS
ORAL_TABLET | ORAL | Status: AC
  Filled 2023-01-28: qty 2

## 2023-01-28 MED ORDER — FENTANYL CITRATE (PF) 100 MCG/2ML IJ SOLN
INTRAMUSCULAR | Status: DC | PRN
  Administered 2023-01-28: 25 ug via INTRAVENOUS

## 2023-01-28 MED ORDER — HEPARIN SODIUM (PORCINE) 1000 UNIT/ML IJ SOLN
INTRAMUSCULAR | Status: AC
  Filled 2023-01-28: qty 10

## 2023-01-28 MED ORDER — ASPIRIN 81 MG PO CHEW
81.0000 mg | CHEWABLE_TABLET | Freq: Every day | ORAL | Status: DC
  Administered 2023-01-29: 81 mg via ORAL
  Filled 2023-01-28: qty 1

## 2023-01-28 MED ORDER — SODIUM CHLORIDE 0.9 % IV SOLN
250.0000 mL | INTRAVENOUS | Status: DC | PRN
  Administered 2023-01-29: 250 mL via INTRAVENOUS

## 2023-01-28 MED ORDER — VERAPAMIL HCL 2.5 MG/ML IV SOLN
INTRAVENOUS | Status: DC | PRN
  Administered 2023-01-28: 2.5 mg via INTRA_ARTERIAL

## 2023-01-28 MED ORDER — HEPARIN BOLUS VIA INFUSION
1950.0000 [IU] | Freq: Once | INTRAVENOUS | Status: AC
  Administered 2023-01-28: 1950 [IU] via INTRAVENOUS
  Filled 2023-01-28: qty 1950

## 2023-01-28 MED ORDER — MIDAZOLAM HCL 2 MG/2ML IJ SOLN
INTRAMUSCULAR | Status: AC
  Filled 2023-01-28: qty 2

## 2023-01-28 MED ORDER — CLOPIDOGREL BISULFATE 75 MG PO TABS
ORAL_TABLET | ORAL | Status: DC | PRN
  Administered 2023-01-28: 600 mg via ORAL

## 2023-01-28 MED ORDER — SODIUM CHLORIDE 0.9 % IV SOLN: INTRAVENOUS | Status: DC

## 2023-01-28 MED ORDER — SODIUM CHLORIDE 0.9 % WEIGHT BASED INFUSION
1.0000 mL/kg/h | INTRAVENOUS | Status: AC
  Administered 2023-01-29: 1 mL/kg/h via INTRAVENOUS

## 2023-01-28 MED ORDER — CLOPIDOGREL BISULFATE 75 MG PO TABS
75.0000 mg | ORAL_TABLET | Freq: Every day | ORAL | Status: DC
  Administered 2023-01-29: 75 mg via ORAL
  Filled 2023-01-28: qty 1

## 2023-01-28 MED ORDER — SODIUM CHLORIDE 0.9% FLUSH: 3.0000 mL | Freq: Two times a day (BID) | INTRAVENOUS | Status: DC

## 2023-01-28 MED ORDER — HEPARIN SODIUM (PORCINE) 1000 UNIT/ML IJ SOLN
INTRAMUSCULAR | Status: DC | PRN
  Administered 2023-01-28: 6500 [IU] via INTRAVENOUS
  Administered 2023-01-28: 3500 [IU] via INTRAVENOUS

## 2023-01-28 MED ORDER — LIDOCAINE HCL (PF) 1 % IJ SOLN
INTRAMUSCULAR | Status: DC | PRN
  Administered 2023-01-28: 2 mL

## 2023-01-28 MED ORDER — ASPIRIN 81 MG PO CHEW
CHEWABLE_TABLET | ORAL | Status: DC | PRN
  Administered 2023-01-28: 243 mg via ORAL

## 2023-01-28 MED ORDER — ACETAMINOPHEN 325 MG PO TABS: 650.0000 mg | ORAL_TABLET | ORAL | Status: DC | PRN

## 2023-01-28 SURGICAL SUPPLY — 21 items
BALLN TREK RX 2.25X15 (BALLOONS) ×1
BALLN ~~LOC~~ EUPHORA RX 2.5X20 (BALLOONS) ×1
BALLOON TREK RX 2.25X15 (BALLOONS) IMPLANT
BALLOON ~~LOC~~ EUPHORA RX 2.5X20 (BALLOONS) IMPLANT
CATH 5FR JL3.5 JR4 ANG PIG MP (CATHETERS) IMPLANT
CATH LAUNCHER 6FR EBU 3 (CATHETERS) IMPLANT
DEVICE RAD TR BAND REGULAR (VASCULAR PRODUCTS) IMPLANT
DRAPE BRACHIAL (DRAPES) IMPLANT
GLIDESHEATH SLEND SS 6F .021 (SHEATH) IMPLANT
GUIDEWIRE INQWIRE 1.5J.035X260 (WIRE) IMPLANT
INQWIRE 1.5J .035X260CM (WIRE) ×1
KIT ENCORE 26 ADVANTAGE (KITS) IMPLANT
PACK CARDIAC CATH (CUSTOM PROCEDURE TRAY) ×1 IMPLANT
PROTECTION STATION PRESSURIZED (MISCELLANEOUS) ×1
SET ATX-X65L (MISCELLANEOUS) IMPLANT
STATION PROTECTION PRESSURIZED (MISCELLANEOUS) IMPLANT
STENT ONYX FRONTIER 2.25X18 (Permanent Stent) IMPLANT
STENT ONYX FRONTIER 2.5X22 (Permanent Stent) IMPLANT
TUBING CIL FLEX 10 FLL-RA (TUBING) IMPLANT
WIRE ASAHI PROWATER 180CM (WIRE) IMPLANT
WIRE G HI TQ BMW 190 (WIRE) IMPLANT

## 2023-01-28 NOTE — Progress Notes (Signed)
[facility] Cardiology  SUBJECTIVE: Patient laying in bed, reports chest pain improved   Vitals:   01/28/23 0432 01/28/23 0500 01/28/23 0531 01/28/23 0700  BP: (!) 133/90 127/76 135/80 133/73  Pulse: (!) 101 98 97 (!) 103  Resp: 19 18 20 20   Temp:      TempSrc:      SpO2: 96% 96% 96% 96%  Weight:      Height:         Intake/Output Summary (Last 24 hours) at 01/28/2023 0817 Last data filed at 01/28/2023 2952 Gross per 24 hour  Intake 663.69 ml  Output 301 ml  Net 362.69 ml      PHYSICAL EXAM  General: Well developed, well nourished, in no acute distress HEENT:  Normocephalic and atramatic Neck:  No JVD.  Lungs: Clear bilaterally to auscultation and percussion. Heart: HRRR . Normal S1 and S2 without gallops or murmurs.  Abdomen: Bowel sounds are positive, abdomen soft and non-tender  Msk:  Back normal, normal gait. Normal strength and tone for age. Extremities: No clubbing, cyanosis or edema.   Neuro: Alert and oriented X 3. Psych:  Good affect, responds appropriately   LABS: Basic Metabolic Panel: Recent Labs    01/27/23 0820 01/28/23 0419  NA 135 130*  K 3.8 3.7  CL 100 97*  CO2 24 25  GLUCOSE 130* 168*  BUN 13 10  CREATININE 0.52 0.47  CALCIUM 8.7* 8.4*  MG  --  1.7  PHOS  --  3.0   Liver Function Tests: Recent Labs    01/27/23 0033  AST 30  ALT 16  ALKPHOS 65  BILITOT 0.7  PROT 7.4  ALBUMIN 3.6   Recent Labs    01/27/23 0033  LIPASE 35   CBC: Recent Labs    01/27/23 0033 01/27/23 0820 01/28/23 0419  WBC 13.7* 13.9* 14.9*  NEUTROABS 11.7*  --   --   HGB 13.3 13.3 12.5  HCT 40.4 40.9 37.4  MCV 85.6 85.9 83.3  PLT 306 315 251   Cardiac Enzymes: No results for input(s): "CKTOTAL", "CKMB", "CKMBINDEX", "TROPONINI" in the last 72 hours. BNP: Invalid input(s): "POCBNP" D-Dimer: No results for input(s): "DDIMER" in the last 72 hours. Hemoglobin A1C: No results for input(s): "HGBA1C" in the last 72 hours. Fasting Lipid Panel: No results for  input(s): "CHOL", "HDL", "LDLCALC", "TRIG", "CHOLHDL", "LDLDIRECT" in the last 72 hours. Thyroid Function Tests: No results for input(s): "TSH", "T4TOTAL", "T3FREE", "THYROIDAB" in the last 72 hours.  Invalid input(s): "FREET3" Anemia Panel: No results for input(s): "VITAMINB12", "FOLATE", "FERRITIN", "TIBC", "IRON", "RETICCTPCT" in the last 72 hours.  ECHOCARDIOGRAM COMPLETE  Result Date: 01/27/2023    ECHOCARDIOGRAM REPORT   Patient Name:   Katie Beltran Date of Exam: 01/27/2023 Medical Rec #:  [id number]     Height:       62.0 in Accession #:    [id number]    Weight:       157.0 lb Date of Birth:  02/14/1941    BSA:          1.725 m Patient Age:    82 years      BP:           129/84 mmHg Patient Gender: F             HR:           94 bpm. Exam Location:  [facility] Procedure: 2D Echo, Cardiac Doppler and Color Doppler Indications:     Elevated troponin  History:         Patient has no prior history of Echocardiogram examinations.                  Risk Factors:Dyslipidemia.  Sonographer:     Cristela Blue Referring Phys:  [id number] [facility] MICHELLE TANG Diagnosing Phys: Marcina Millard MD IMPRESSIONS  1. Left ventricular ejection fraction, by estimation, is 35 to 40%. The left ventricle has moderately decreased function. The left ventricle demonstrates regional wall motion abnormalities (see scoring diagram/findings for description). Left ventricular  diastolic parameters are consistent with Grade I diastolic dysfunction (impaired relaxation).  2. Right ventricular systolic function is normal. The right ventricular size is normal.  3. The mitral valve is normal in structure. Mild to moderate mitral valve regurgitation. No evidence of mitral stenosis.  4. Tricuspid valve regurgitation is moderate to severe.  5. The aortic valve is normal in structure. Aortic valve regurgitation is not visualized. Mild to moderate aortic valve stenosis.  6. The inferior vena cava is normal in size with greater than 50% respiratory  variability, suggesting right atrial pressure of 3 mmHg. FINDINGS  Left Ventricle: Left ventricular ejection fraction, by estimation, is 35 to 40%. The left ventricle has moderately decreased function. The left ventricle demonstrates regional wall motion abnormalities. The left ventricular internal cavity size was normal in size. There is no left ventricular hypertrophy. Left ventricular diastolic parameters are consistent with Grade I diastolic dysfunction (impaired relaxation).  LV Wall Scoring: The apical lateral segment, apical septal segment, and apex are hypokinetic. Right Ventricle: The right ventricular size is normal. No increase in right ventricular wall thickness. Right ventricular systolic function is normal. Left Atrium: Left atrial size was normal in size. Right Atrium: Right atrial size was normal in size. Pericardium: There is no evidence of pericardial effusion. Mitral Valve: The mitral valve is normal in structure. Mild to moderate mitral valve regurgitation. No evidence of mitral valve stenosis. MV peak gradient, 4.4 mmHg. The mean mitral valve gradient is 2.0 mmHg. Tricuspid Valve: The tricuspid valve is normal in structure. Tricuspid valve regurgitation is moderate to severe. No evidence of tricuspid stenosis. Aortic Valve: The aortic valve is normal in structure. Aortic valve regurgitation is not visualized. Mild to moderate aortic stenosis is present. Aortic valve mean gradient measures 9.5 mmHg. Aortic valve peak gradient measures 15.9 mmHg. Aortic valve area, by VTI measures 1.26 cm. Pulmonic Valve: The pulmonic valve was normal in structure. Pulmonic valve regurgitation is not visualized. No evidence of pulmonic stenosis. Aorta: The aortic root is normal in size and structure. Venous: The inferior vena cava is normal in size with greater than 50% respiratory variability, suggesting right atrial pressure of 3 mmHg. IAS/Shunts: No atrial level shunt detected by color flow Doppler.  LEFT  VENTRICLE PLAX 2D LVIDd:         4.60 cm     Diastology LVIDs:         3.40 cm     LV e' medial:    2.61 cm/s LV PW:         1.20 cm     LV E/e' medial:  33.3 LV IVS:        1.50 cm     LV e' lateral:   8.27 cm/s LVOT diam:     2.10 cm     LV E/e' lateral: 10.5 LV SV:         42 LV SV Index:   24 LVOT Area:     3.46  cm  LV Volumes (MOD) LV vol d, MOD A2C: 93.2 ml LV vol d, MOD A4C: 92.0 ml LV vol s, MOD A2C: 73.5 ml LV vol s, MOD A4C: 52.7 ml LV SV MOD A2C:     19.7 ml LV SV MOD A4C:     92.0 ml LV SV MOD BP:      29.0 ml RIGHT VENTRICLE RV Basal diam:  3.40 cm RV Mid diam:    2.50 cm LEFT ATRIUM           Index        RIGHT ATRIUM           Index LA diam:      3.40 cm 1.97 cm/m   RA Area:     13.30 cm LA Vol (A4C): 26.2 ml 15.19 ml/m  RA Volume:   29.80 ml  17.28 ml/m  AORTIC VALVE AV Area (Vmax):    1.14 cm AV Area (Vmean):   1.06 cm AV Area (VTI):     1.26 cm AV Vmax:           199.50 cm/s AV Vmean:          142.250 cm/s AV VTI:            0.334 m AV Peak Grad:      15.9 mmHg AV Mean Grad:      9.5 mmHg LVOT Vmax:         65.50 cm/s LVOT Vmean:        43.700 cm/s LVOT VTI:          0.122 m LVOT/AV VTI ratio: 0.36  AORTA Ao Root diam: 3.00 cm MITRAL VALVE                TRICUSPID VALVE MV Area (PHT): 5.58 cm     TR Peak grad:   37.5 mmHg MV Area VTI:   2.27 cm     TR Vmax:        306.00 cm/s MV Peak grad:  4.4 mmHg MV Mean grad:  2.0 mmHg     SHUNTS MV Vmax:       1.05 m/s     Systemic VTI:  0.12 m MV Vmean:      70.9 cm/s    Systemic Diam: 2.10 cm MV Decel Time: 136 msec MV E velocity: 87.00 cm/s MV A velocity: 110.00 cm/s MV E/A ratio:  0.79 Marcina Millard MD Electronically signed by Marcina Millard MD Signature Date/Time: 01/27/2023/4:20:13 PM    Final    DG Chest Portable 1 View  Result Date: 01/27/2023 CLINICAL DATA:  Epigastric pain. EXAM: PORTABLE CHEST 1 VIEW COMPARISON:  None Available. FINDINGS: The heart size and mediastinal contours are within normal limits. There is moderate  severity calcification of the thoracic aorta. Mild, diffuse, chronic appearing increased interstitial lung markings are seen. Mild atelectatic changes are noted within the bilateral lung bases. No pleural effusion or pneumothorax is seen. The visualized skeletal structures are unremarkable. IMPRESSION: Mild bibasilar atelectasis. Electronically Signed   By: Aram Candela M.D.   On: 01/27/2023 02:41   CT Angio Chest Pulmonary Embolism (PE) W or WO Contrast  Result Date: 01/27/2023 CLINICAL DATA:  Epigastric pain. EXAM: CT ANGIOGRAPHY CHEST WITH CONTRAST TECHNIQUE: Multidetector CT imaging of the chest was performed using the standard protocol during bolus administration of intravenous contrast. Multiplanar CT image reconstructions and MIPs were obtained to evaluate the vascular anatomy. RADIATION DOSE REDUCTION: This exam was performed according to the  departmental dose-optimization program which includes automated exposure control, adjustment of the mA and/or kV according to patient size and/or use of iterative reconstruction technique. CONTRAST:  OMNIPAQUE IOHEXOL 350 MG/ML SOLN COMPARISON:  January 26, 2023 FINDINGS: Cardiovascular: There is marked severity calcification of the aortic arch, without evidence of aortic aneurysm. Satisfactory opacification of the pulmonary arteries to the segmental level. No evidence of pulmonary embolism. Normal heart size. No pericardial effusion. Mediastinum/Nodes: There is mild right hilar lymphadenopathy. The thyroid gland and trachea demonstrate no significant findings. Chronic appearing dilatation of the esophagus is seen. Lungs/Pleura: Mild atelectatic changes are seen within the posterior aspect of the bilateral upper lobes and bilateral lower lobes. No pleural effusion or pneumothorax is identified. Upper Abdomen: There is a small hiatal hernia. A 15 mm diameter focus of parenchymal low attenuation is seen within the anteromedial aspect of the right lobe of the  liver. A 12 mm gallstone is seen within the lumen of an otherwise normal-appearing gallbladder. Musculoskeletal: Multilevel degenerative changes seen throughout the thoracic spine. Review of the MIP images confirms the above findings. IMPRESSION: 1. No evidence of pulmonary embolism. 2. Mild bilateral upper lobe and bilateral lower lobe atelectasis. 3. Small hiatal hernia. 4. Cholelithiasis. 5. Aortic atherosclerosis. Aortic Atherosclerosis (ICD10-I70.0). Electronically Signed   By: Aram Candela M.D.   On: 01/27/2023 02:27   CT CARDIAC SCORING (SELF PAY ONLY)  Result Date: 01/26/2023 CLINICAL DATA:  Risk stratification EXAM: Coronary Calcium Score TECHNIQUE: The patient was scanned on a Siemens Somatom scanner. Axial non-contrast 3 mm slices were carried out through the heart. The data set was analyzed on a dedicated work station and scored using the Agatson method. FINDINGS: Non-cardiac: See separate report from [facility] Radiology. Ascending Aorta: Normal size, aortic atherosclerosis Pericardium: Normal Coronary arteries: Normal origin of left and right coronary arteries. Distribution of arterial calcifications if present, as noted below; LM 0 LAD 15 LCx 0 RCA 0 Total 15 IMPRESSION AND RECOMMENDATION: 1. Coronary calcium score of 15. This was 16th percentile for age and sex matched control. 2. CAC 1-99 in LAD. CAC-DRS A1/N1 3. Continue heart healthy lifestyle and risk factor modification. Electronically Signed   By: Debbe Odea M.D.   On: 01/26/2023 18:04     Echo LVEF 35-40% with anterior apical hypokinesis with moderate to severe tricuspid regurgitation  TELEMETRY: Sinus tachycardia:  ASSESSMENT AND PLAN:  Principal Problem:   NSTEMI (non-ST elevated myocardial infarction) (HCC) Active Problems:   GERD without esophagitis   Pure hypercholesterolemia   Hypertensive urgency   COVID-19 virus infection   Leukocytosis    1.  NSTEMI (high-sensitivity troponin 1390, 1730, 5376, 6030,  7643, 11,976, 11,691, 14,132, 15,765), with typical and atypical chest pain, on heparin infusion and nitroglycerin drip 2.  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  COVID-19 illness.  Patient contracted COVID-19 while trip to [place] 01/01/2023 with fever and chills, remains positive 4.  Possible myocarditis/pericarditis, pleuritic chest pain improved on colchicine 5.  Hypertensive urgency upon presentation, blood pressure in normal range 6.  Multiple drug intolerances  Recommendations  1.  Agree with current therapy 2.  Continue heparin drip 48-72 hours 3.  Continue nitroglycerin drip 4.  Continue low-dose aspirin 5.  Continue colchicine 7.  Add low-dose metoprolol succinate 25 mg daily 8.  Discussed in detail with patient and patient's daughter about the risk, benefits and alternatives of cardiac catheterization.  Patient has been hesitant initially about invasive evaluation.  Marcina Millard, MD, PhD, [facility] 01/28/2023 8:17 AM

## 2023-01-28 NOTE — Progress Notes (Signed)
Progress Note   Patient: Katie Beltran AOZ:[id number] DOB: Oct 01, 1940 DOA: 01/27/2023     1 DOS: the patient was seen and examined on 01/28/2023      Subjective:  Patient seen and examined this morning in the presence of the husband as well as 2 daughters Had some pressure-like chest pain however improving Continues to be on ACS protocol Patient was seen by cardiologist early this morning with plans to have cardiac catheterization later today Denies nausea vomiting diaphoresis cough or worsening shortness of breath    Brief hospital course: From HPI "Katie Beltran is a 82 y.o. female with medical history significant of hypertension, GERD, hyperlipidemia, recurrent UTI, coronary artery disease presenting with NSTEMI, hypertensive urgency, COVID-19.  Patient reported generalized malaise over the past 24 hours.  Symptoms predominately happened overnight.  Patient states she regularly checks her blood pressure.  Blood pressure was in the 200s over 100s at home.  Took her home dose of Imdur which was recently prescribed by Dr. Juliann Pares.  Blood pressure mildly improved at home.  Positive generalized chest pain nausea and malaise.  Noted to have been recently evaluated by Dr. Juliann Pares for coronary artery disease.  Had a coronary CT done yesterday which showed a calcium score over 15.  There was some discussion about further evaluation going forward.  No shortness of breath.  Mild nausea.  Patient does report diagnosis of COVID while patient was in [place] 3 weeks ago.  No cough.  No diarrhea. Presented to the ER afebrile, heart rate 100s, blood pressures 150s to 190s over 90s to 100s.  Troponin 1390-->5526.  White count 13.7, hemoglobin 13.3, creatinine 0.6.  COVID-positive.  CT angio chest negative for any PE, mild bilateral upper lobe and bilateral lobe atelectasis.  Cholelithiasis.  "  Assessment and Plan: NSTEMI (non-ST elevated myocardial infarction) (HCC) Positive chest pain and generalized  malaise as well as hypertensive urgency Noted recent evaluation with Dr. Juliann Pares and coronary CT with calcium score 15 Troponin 1300s-->5500-continues to uptrend EKG sinus tachycardia  HEART score >6 Case discussed with cardiologist planning cardiac catheterization today Continues to be on heparin drip Chest pain much better now Follow-up on echocardiogram Continue statin therapy Continue colchicine for any concerns of pericarditis/myocarditis Continue low-dose aspirin therapy Added metoprolol today    Leukocytosis Suspect likely reactive in setting of NSTEMI and COVID No hypoxia present No reported urinary symptoms in the setting history of recurrent UTIs CT angio chest negative for any overt pneumonia Will otherwise monitor and trend for now Reassess as clinically appropriate I have ordered urinalysis   COVID-19 virus infection Noted formal COVID-19 diagnosis relatively 3 to 4 weeks ago with associated trip to [place] per patient COVID-19 positive in the setting of NSTEMI CT scan of the chest reviewed by me did not show acute infiltrate aside atelectasis No pulmonary embolism appreciated No hypoxia at present Unable to tolerate steroids Continue symptomatic management Will otherwise symptomatically manage for now     Hypertensive urgency Systolic pressures into the 200s over 100s with associated NSTEMI Patient initially required nitro drip Continue as needed hydralazine Blood pressure much better now Follow-up with cardiologist recommendation Monitor blood pressure closely   Pure hypercholesterolemia Continue Zetia Noted statin intolerance    GERD without esophagitis Continue PPI      Advance Care Planning:   Code Status: Full Code    Consults: Cardiology    Family Communication: Family at the bedside   Physical Exam: General: Elderly female laying in bed in  no acute distress HENT:     Head: Normocephalic and atraumatic.     Nose: Nose normal.      Mouth/Throat:     Mouth: Mucous membranes are moist.  Eyes:     Pupils: Pupils are equal, round, and reactive to light.  Cardiovascular: Normal rate and rhythm Pulmonary:     Effort: Pulmonary effort is normal.  Abdominal:     General: Bowel sounds are normal.  Musculoskeletal:        General: Normal range of motion.     Cervical back: Normal range of motion.  Skin:    General: Skin is warm.  Neurological:     General: No focal deficit present.  Psychiatric:        Mood and Affect: Mood normal.    Data Reviewed: Patient seen and examined at bedside, I have reviewed the patient's troponin that is uptrending, CBC as well as BMP results, concerning low sodium of 130, with some leukocytosis, chest x-ray personally reviewed by me did not show any infiltrate, CT scan of the chest personally reviewed by me did not show show any pulmonary embolism, I have also reviewed cardiology documentation and discussed the case  Family Communication: Discussed with patient's family present at bedside  Vitals:   01/28/23 0531 01/28/23 0700 01/28/23 1200 01/28/23 1501  BP: 135/80 133/73 132/84 128/82  Pulse: 97 (!) 103 (!) 107 (!) 108  Resp: 20 20 17 16   Temp:    97.7 F (36.5 C)  TempSrc:    Oral  SpO2: 96% 96% 95% 94%  Weight:      Height:         Author: Loyce Dys, MD 01/28/2023 5:25 PM  For on call review [email or web address].

## 2023-01-28 NOTE — TOC Benefit Eligibility Note (Signed)
Pharmacy Patient Advocate Encounter  Insurance verification completed.    The patient is insured through [employer] Part D  Ran test claim for Reptatha SureClick 140 mg/ml and Requires Prior Authorization   This test claim was processed through [employer]- copay amounts may vary at other pharmacies due to [employer], or as the patient moves through the different stages of their insurance plan.    Roland Earl, CPHT Pharmacy Patient Advocate Specialist [facility] Patient Advocate Team Direct Number: [phone]  Fax: [phone]

## 2023-01-28 NOTE — Progress Notes (Signed)
ANTICOAGULATION CONSULT NOTE  Pharmacy Consult for Heparin  Indication: chest pain/ACS  Allergies  Allergen Reactions   Propranolol Other (See Comments)    Vomiting, cough, throat irritation    Amlodipine Swelling   Ciprofloxacin Other (See Comments)    Chest tightness   Irbesartan Other (See Comments)   Lisinopril Other (See Comments)   Omeprazole Other (See Comments)    Leg pain and burning   Pantoprazole Other (See Comments)    Leg pain and burning   Symbicort [Budesonide-Formoterol Fumarate]     Chills fever   Doxycycline Rash   Prednisone Rash    Patient Measurements: Height: 5\' 2"  (157.5 cm) Weight: 71.2 kg (157 lb) IBW/kg (Calculated) : 50.1 Heparin Dosing Weight: 65.2 kg   Vital Signs: Temp: 97.8 F (36.6 C) (08/02 0420) Temp Source: Oral (08/02 0420) BP: 133/90 (08/02 0432) Pulse Rate: 101 (08/02 0432)  Labs: Recent Labs    01/27/23 0033 01/27/23 0205 01/27/23 0315 01/27/23 0820 01/27/23 1023 01/27/23 1201 01/27/23 1340 01/27/23 1538 01/27/23 1854 01/27/23 2256 01/28/23 0419  HGB 13.3  --   --  13.3  --   --   --   --   --   --  12.5  HCT 40.4  --   --  40.9  --   --   --   --   --   --  37.4  PLT 306  --   --  315  --   --   --   --   --   --  251  APTT  --   --  32  --   --   --   --   --   --   --   --   LABPROT  --   --  16.4*  --   --   --   --   --   --   --   --   INR  --   --  1.3*  --   --   --   --   --   --   --   --   HEPARINUNFRC  --   --   --   --   --  <0.10*  --   --  0.12*  --  0.18*  CREATININE 0.61  --   --  0.52  --   --   --   --   --   --   --   TROPONINIHS 1,390*   < >  --  5,526*   < >  --    < > 11,072* 11,651* 14,132*  --    < > = values in this interval not displayed.    Estimated Creatinine Clearance: 50.1 mL/min (by C-G formula based on SCr of 0.52 mg/dL).   Medical History: Past Medical History:  Diagnosis Date   Acid reflux    Environmental allergies    Hypercholesteremia    Hypertension    Recurrent UTI      Medications:  Medications Prior to Admission  Medication Sig Dispense Refill Last Dose   ezetimibe (ZETIA) 10 MG tablet Take 10 mg by mouth daily.   Past Week   famotidine (PEPCID) 20 MG tablet Take 20 mg by mouth 2 (two) times daily.   01/26/2023 at am   isosorbide mononitrate (IMDUR) 30 MG 24 hr tablet Take 15 mg by mouth 2 (two) times daily.   01/26/2023 at 2130   loratadine (CLARITIN) 10  MG tablet Take 10 mg by mouth daily.   01/26/2023   Multiple Vitamin (MULTI-VITAMIN) tablet Take 1 tablet by mouth daily.   01/26/2023   zinc gluconate 50 MG tablet Take 50 mg by mouth daily.   Past Month   isosorbide dinitrate (ISORDIL) 30 MG tablet Take 30 mg by mouth 4 (four) times daily. (Patient not taking: Reported on 01/27/2023)   Not Taking    Assessment: Pharmacy consulted to dose heparin in this 82 year old female admitted with ACS/NSTEMI.  No prior anticoag noted. CrCl = 50.1 ml/min  Goal of Therapy:  HL : 0.3 - 0.7  Monitor platelets by anticoagulation protocol: Yes   08/01 1854 HL 0.12, subtherapeutic 08/02 0419 HL 0.18, subtherapeutic   Plan:  8/02: HL @ 0419 = 0.18, SUBtherapeutic - Will order heparin 1950 units IV X 1 bolus and increase drip rate to 1250 units/hr. Recheck HL in 8 hr after rate change. CBC daily while on heparin infusion.  Bryannah Boston D 01/28/2023 5:03 AM

## 2023-01-28 NOTE — Plan of Care (Signed)

## 2023-01-28 NOTE — Progress Notes (Signed)
ANTICOAGULATION CONSULT NOTE  Pharmacy Consult for Heparin  Indication: chest pain/ACS  Allergies  Allergen Reactions   Propranolol Other (See Comments)    Vomiting, cough, throat irritation    Amlodipine Swelling   Ciprofloxacin Other (See Comments)    Chest tightness   Irbesartan Other (See Comments)   Lisinopril Other (See Comments)   Omeprazole Other (See Comments)    Leg pain and burning   Pantoprazole Other (See Comments)    Leg pain and burning   Statins Other (See Comments)    Muscle pian/leg pain   Symbicort [Budesonide-Formoterol Fumarate]     Chills fever   Doxycycline Rash   Prednisone Rash    Patient Measurements: Height: 5\' 2"  (157.5 cm) Weight: 71.2 kg (157 lb) IBW/kg (Calculated) : 50.1 Heparin Dosing Weight: 65.2 kg   Vital Signs: Temp: 97.8 F (36.6 C) (08/02 0420) Temp Source: Oral (08/02 0420) BP: 132/84 (08/02 1200) Pulse Rate: 107 (08/02 1200)  Labs: Recent Labs    01/27/23 0033 01/27/23 0205 01/27/23 0315 01/27/23 0820 01/27/23 1023 01/27/23 1854 01/27/23 2256 01/28/23 0419 01/28/23 1326  HGB 13.3  --   --  13.3  --   --   --  12.5  --   HCT 40.4  --   --  40.9  --   --   --  37.4  --   PLT 306  --   --  315  --   --   --  251  --   APTT  --   --  32  --   --   --   --   --   --   LABPROT  --   --  16.4*  --   --   --   --   --   --   INR  --   --  1.3*  --   --   --   --   --   --   HEPARINUNFRC  --   --   --   --    < > 0.12*  --  0.18* 0.36  CREATININE 0.61  --   --  0.52  --   --   --  0.47  --   TROPONINIHS 1,390*   < >  --  5,526*   < > 11,651* 14,132* 15,765*  --    < > = values in this interval not displayed.    Estimated Creatinine Clearance: 50.1 mL/min (by C-G formula based on SCr of 0.47 mg/dL).   Medical History: Past Medical History:  Diagnosis Date   Acid reflux    Environmental allergies    Hypercholesteremia    Hypertension    Recurrent UTI     Medications:  Medications Prior to Admission  Medication  Sig Dispense Refill Last Dose   ezetimibe (ZETIA) 10 MG tablet Take 10 mg by mouth daily.   Past Week   famotidine (PEPCID) 20 MG tablet Take 20 mg by mouth 2 (two) times daily.   01/26/2023 at am   isosorbide mononitrate (IMDUR) 30 MG 24 hr tablet Take 15 mg by mouth 2 (two) times daily.   01/26/2023 at 2130   loratadine (CLARITIN) 10 MG tablet Take 10 mg by mouth daily.   01/26/2023   Multiple Vitamin (MULTI-VITAMIN) tablet Take 1 tablet by mouth daily.   01/26/2023   zinc gluconate 50 MG tablet Take 50 mg by mouth daily.   Past Month   isosorbide dinitrate (ISORDIL)  30 MG tablet Take 30 mg by mouth 4 (four) times daily. (Patient not taking: Reported on 01/27/2023)   Not Taking    Assessment: Pharmacy consulted to dose heparin in this 82 year old female admitted with ACS/NSTEMI.  No prior anticoag noted. CrCl = 50.1 ml/min  Goal of Therapy:  HL : 0.3 - 0.7  Monitor platelets by anticoagulation protocol: Yes   08/01 1854 HL 0.12, subtherapeutic 08/02 0419 HL 0.18, subtherapeutic  08/02 1326 HL 0.36, therapeutic x 1 (cath scheduled for 1600)  Plan:  Continue heparin drip rate at 1250 units/hr. Recheck HL in 8 hr to confirm therapeutic rate CBC daily while on heparin infusion.  Ailanie Ruttan Rodriguez-Guzman PharmD, BCPS 01/28/2023 1:57 PM

## 2023-01-29 DIAGNOSIS — I214 Non-ST elevation (NSTEMI) myocardial infarction: Secondary | ICD-10-CM | POA: Diagnosis not present

## 2023-01-29 MED ORDER — APIXABAN 5 MG PO TABS: 5.0000 mg | ORAL_TABLET | Freq: Two times a day (BID) | ORAL | 11 refills | Status: DC

## 2023-01-29 MED ORDER — METOPROLOL SUCCINATE ER 25 MG PO TB24: 25.0000 mg | ORAL_TABLET | Freq: Every day | ORAL | 0 refills | Status: AC

## 2023-01-29 MED ORDER — APIXABAN 5 MG PO TABS: 5.0000 mg | ORAL_TABLET | Freq: Two times a day (BID) | ORAL | 11 refills | Status: AC

## 2023-01-29 MED ORDER — ZINC GLUCONATE 50 MG PO TABS: 50.0000 mg | ORAL_TABLET | Freq: Every day | ORAL | 0 refills | Status: AC

## 2023-01-29 MED ORDER — CLOPIDOGREL BISULFATE 75 MG PO TABS: 75.0000 mg | ORAL_TABLET | Freq: Every day | ORAL | 11 refills | Status: DC

## 2023-01-29 MED ORDER — APIXABAN 5 MG PO TABS
5.0000 mg | ORAL_TABLET | Freq: Two times a day (BID) | ORAL | Status: DC
  Administered 2023-01-29: 5 mg via ORAL
  Filled 2023-01-29: qty 1

## 2023-01-29 MED ORDER — ROSUVASTATIN CALCIUM 5 MG PO TABS: 5.0000 mg | ORAL_TABLET | Freq: Every day | ORAL | 0 refills | Status: DC

## 2023-01-29 MED ORDER — ACETAMINOPHEN 325 MG PO TABS: 650.0000 mg | ORAL_TABLET | ORAL | 0 refills | Status: DC | PRN

## 2023-01-29 MED ORDER — ASPIRIN 81 MG PO CHEW: 81.0000 mg | CHEWABLE_TABLET | Freq: Every day | ORAL | 0 refills | Status: DC

## 2023-01-29 MED ORDER — FAMOTIDINE 20 MG PO TABS
20.0000 mg | ORAL_TABLET | Freq: Every day | ORAL | Status: DC
  Administered 2023-01-29: 20 mg via ORAL
  Filled 2023-01-29: qty 1

## 2023-01-29 MED ORDER — FAMOTIDINE 20 MG PO TABS: 20.0000 mg | ORAL_TABLET | Freq: Two times a day (BID) | ORAL | 0 refills | Status: AC

## 2023-01-29 MED ORDER — METOPROLOL SUCCINATE ER 25 MG PO TB24
25.0000 mg | ORAL_TABLET | Freq: Every day | ORAL | Status: DC
  Administered 2023-01-29: 25 mg via ORAL
  Filled 2023-01-29: qty 1

## 2023-01-29 MED ORDER — METOPROLOL SUCCINATE ER 25 MG PO TB24: 25.0000 mg | ORAL_TABLET | Freq: Every day | ORAL | 0 refills | Status: DC

## 2023-01-29 MED ORDER — ROSUVASTATIN CALCIUM 5 MG PO TABS
5.0000 mg | ORAL_TABLET | Freq: Every day | ORAL | Status: DC
  Administered 2023-01-29: 5 mg via ORAL
  Filled 2023-01-29: qty 1

## 2023-01-29 MED ORDER — MULTI-VITAMIN PO TABS: 1.0000 | ORAL_TABLET | Freq: Every day | ORAL | 0 refills | Status: DC

## 2023-01-29 MED ORDER — ZINC GLUCONATE 50 MG PO TABS: 50.0000 mg | ORAL_TABLET | Freq: Every day | ORAL | 0 refills | Status: DC

## 2023-01-29 MED ORDER — ASPIRIN 81 MG PO CHEW: 81.0000 mg | CHEWABLE_TABLET | Freq: Every day | ORAL | 0 refills | Status: AC

## 2023-01-29 MED ORDER — CLOPIDOGREL BISULFATE 75 MG PO TABS: 75.0000 mg | ORAL_TABLET | Freq: Every day | ORAL | 11 refills | Status: AC

## 2023-01-29 MED ORDER — MULTI-VITAMIN PO TABS: 1.0000 | ORAL_TABLET | Freq: Every day | ORAL | 0 refills | Status: AC

## 2023-01-29 MED ORDER — ACETAMINOPHEN 325 MG PO TABS: 650.0000 mg | ORAL_TABLET | ORAL | 0 refills | Status: AC | PRN

## 2023-01-29 MED ORDER — POTASSIUM CHLORIDE CRYS ER 20 MEQ PO TBCR
40.0000 meq | EXTENDED_RELEASE_TABLET | ORAL | Status: AC
  Administered 2023-01-29 (×2): 40 meq via ORAL
  Filled 2023-01-29 (×2): qty 2

## 2023-01-29 MED ORDER — ROSUVASTATIN CALCIUM 5 MG PO TABS: 5.0000 mg | ORAL_TABLET | Freq: Every day | ORAL | 0 refills | Status: AC

## 2023-01-29 NOTE — Discharge Summary (Signed)
Physician Discharge Summary   Patient: Katie Beltran MRN: [id number] DOB: 1940/09/15  Admit date:     01/27/2023  Discharge date: 01/29/23  Discharge Physician: Loyce Dys   PCP: Marguarite Arbour, MD     Discharge Diagnoses:  NSTEMI (non-ST elevated myocardial infarction) (HCC) Leukocytosis COVID-19 virus infection Hypertensive urgency Pure hypercholesterolemia GERD without esophagitis  Hospital Course: Katie Beltran is a 82 y.o. female with medical history significant of hypertension, GERD, hyperlipidemia, recurrent UTI, coronary artery disease presenting with NSTEMI, hypertensive urgency, COVID-19.  Patient reported generalized malaise over the past 24 hours prior to presentation.  Presented to the ER afebrile, heart rate 100s, blood pressures 150s to 190s over 90s to 100s.  Troponin trended up as high as 15,000.  White count 13.7, hemoglobin 13.3, creatinine 0.6.  COVID-positive.  CT angio chest negative for any PE, mild bilateral upper lobe and bilateral lobe atelectasis.  Cholelithiasis.  Echo showed EF 35 to 40%. Given concerns of NSTEMI patient was taken for cardiac catheterization with placement of 2 drug-eluting stents.  Patient has been cleared for discharge today after a walk test and to follow-up with cardiologist   Consultants: Cardiology Procedures performed: Catheter catheterization with stent placement Disposition: Home Diet recommendation:  Discharge Diet Orders (From admission, onward)     Start     Ordered   01/29/23 0000  Diet - low sodium heart healthy        01/29/23 1709           Cardiac diet DISCHARGE MEDICATION: Allergies as of 01/29/2023       Reactions   Propranolol Other (See Comments)   Vomiting, cough, throat irritation    Amlodipine Swelling   Ciprofloxacin Other (See Comments)   Chest tightness   Irbesartan Other (See Comments)   Lisinopril Other (See Comments)   Omeprazole Other (See Comments)   Leg pain and burning    Pantoprazole Other (See Comments)   Leg pain and burning   Statins Other (See Comments)   Muscle pian/leg pain   Symbicort [budesonide-formoterol Fumarate]    Chills fever   Doxycycline Rash   Prednisone Rash        Medication List     STOP taking these medications    ezetimibe 10 MG tablet Commonly known as: ZETIA   isosorbide dinitrate 30 MG tablet Commonly known as: ISORDIL   isosorbide mononitrate 30 MG 24 hr tablet Commonly known as: IMDUR   loratadine 10 MG tablet Commonly known as: CLARITIN       TAKE these medications    acetaminophen 325 MG tablet Commonly known as: TYLENOL Take 2 tablets (650 mg total) by mouth every 4 (four) hours as needed for headache or mild pain.   apixaban 5 MG Tabs tablet Commonly known as: ELIQUIS Take 1 tablet (5 mg total) by mouth 2 (two) times daily.   aspirin 81 MG chewable tablet Chew 1 tablet (81 mg total) by mouth daily for 14 days. Start taking on: January 30, 2023   clopidogrel 75 MG tablet Commonly known as: PLAVIX Take 1 tablet (75 mg total) by mouth daily with breakfast. Start taking on: January 30, 2023   famotidine 20 MG tablet Commonly known as: PEPCID Take 20 mg by mouth 2 (two) times daily.   metoprolol succinate 25 MG 24 hr tablet Commonly known as: TOPROL-XL Take 1 tablet (25 mg total) by mouth daily. Start taking on: January 30, 2023   Multi-Vitamin tablet Take 1 tablet by  mouth daily.   rosuvastatin 5 MG tablet Commonly known as: CRESTOR Take 1 tablet (5 mg total) by mouth daily.   zinc gluconate 50 MG tablet Take 50 mg by mouth daily.        Follow-up Information     Alwyn Pea, MD. Go in 1 week(s).   Specialties: Cardiology, Internal Medicine Contact information: [address] [place] [address] [phone]                Discharge Exam: Ceasar Mons Weights   01/27/23 0031 01/27/23 1359  Weight: 71.2 kg 71.2 kg   Head: Normocephalic and atraumatic.      Nose: Nose normal.     Mouth/Throat:     Mouth: Mucous membranes are moist.  Eyes:     Pupils: Pupils are equal, round, and reactive to light.  Cardiovascular: Normal rate and rhythm Pulmonary:     Effort: Pulmonary effort is normal.  Abdominal:     General: Bowel sounds are normal.  Musculoskeletal:        General: Normal range of motion.     Cervical back: Normal range of motion.  Skin:    General: Skin is warm.  Neurological:     General: No focal deficit present.  Psychiatric:        Mood and Affect: Mood normal  Condition at discharge: good   Discharge time spent:  38 minutes.  Signed: Loyce Dys, MD [facility] 01/29/2023

## 2023-01-29 NOTE — Progress Notes (Signed)
       CROSS COVER NOTE  NAME: Katie Beltran MRN: [id number] DOB : 1940/09/06    Concern as stated by nurse / staff   Message received from [facility], the patient returned from Cardiac Cath at 2100. two stents placed. Sinus Tach in the 101 - 110's. She has now converted to Afib. HR 120 - 170 fluctuation. She is now 132 Afib. She has a motoprolol PO scheduled for 8am. May I have Metoprolol IV now?      Pertinent findings on chart review: Patient admitted with hypertensive urgency. Had elevated troponins with  typical and atypical chest pain, Cardiac cath shows EF 35-40%. Cardiac cath with stent placed earlier today  Assessment and  Interventions   Assessment: EKG reviewed by me: a fib, non specific ST t wave changes. Previous EKG ST with lateral ischemia(T wave inversion I, AVL v2,v4, v5)  Plan: Notify cardiology of change in rhythm Start previously ordered oral metoprolol now       Donnie Mesa NP [facility] Cross Cover 7pm-7am - check amion for availability Pager [phone]

## 2023-01-29 NOTE — Plan of Care (Signed)

## 2023-01-29 NOTE — Progress Notes (Signed)
SATURATION QUALIFICATIONS: (This note is used to comply with regulatory documentation for home oxygen)  Patient Saturations on Room Air at Rest = 94%  Patient Saturations on Room Air while Ambulating = 97%  Patient Saturations on 0 Liters of oxygen while Ambulating = 97%  Please briefly explain why patient needs home oxygen:  As soon as patient started to walk O2 dropped to 84%, put on 1L of oxygen, SPO2 increased to 100%, O2 turned off and patient stayed at 96-97% on room air while ambulating.

## 2023-01-29 NOTE — Progress Notes (Signed)
Patient has converted to A-fib on telemetry. MD made aware. EKG done. Orders written. Patient denies pain. Nonsymptomatic.

## 2023-01-29 NOTE — Progress Notes (Signed)
[name] Cardiology  SUBJECTIVE: Patient laying in bed, denies chest pain   Vitals:   01/29/23 0500 01/29/23 0600 01/29/23 0700 01/29/23 0800  BP:      Pulse:      Resp: (!) 24 15 (!) 28 (!) 23  Temp:      TempSrc:      SpO2:      Weight:      Height:         Intake/Output Summary (Last 24 hours) at 01/29/2023 1191 Last data filed at 01/29/2023 0445 Gross per 24 hour  Intake 1550.51 ml  Output 702 ml  Net 848.51 ml      PHYSICAL EXAM  General: Well developed, well nourished, in no acute distress HEENT:  Normocephalic and atramatic Neck:  No JVD.  Lungs: Clear bilaterally to auscultation and percussion. Heart: HRRR . Normal S1 and S2 without gallops or murmurs.  Abdomen: Bowel sounds are positive, abdomen soft and non-tender  Msk:  Back normal, normal gait. Normal strength and tone for age. Extremities: No clubbing, cyanosis or edema.   Neuro: Alert and oriented X 3. Psych:  Good affect, responds appropriately   LABS: Basic Metabolic Panel: Recent Labs    01/28/23 0419 01/29/23 0607  NA 130* 132*  K 3.7 3.4*  CL 97* 103  CO2 25 23  GLUCOSE 168* 132*  BUN 10 14  CREATININE 0.47 0.54  CALCIUM 8.4* 8.0*  MG 1.7  --   PHOS 3.0  --    Liver Function Tests: Recent Labs    01/27/23 0033  AST 30  ALT 16  ALKPHOS 65  BILITOT 0.7  PROT 7.4  ALBUMIN 3.6   Recent Labs    01/27/23 0033  LIPASE 35   CBC: Recent Labs    01/27/23 0033 01/27/23 0820 01/28/23 0419 01/29/23 0607  WBC 13.7*   < > 14.9* 13.9*  NEUTROABS 11.7*  --   --  11.1*  HGB 13.3   < > 12.5 11.6*  HCT 40.4   < > 37.4 34.9*  MCV 85.6   < > 83.3 83.5  PLT 306   < > 251 234   < > = values in this interval not displayed.   Cardiac Enzymes: No results for input(s): "CKTOTAL", "CKMB", "CKMBINDEX", "TROPONINI" in the last 72 hours. BNP: Invalid input(s): "POCBNP" D-Dimer: No results for input(s): "DDIMER" in the last 72 hours. Hemoglobin A1C: No results for input(s): "HGBA1C" in the last  72 hours. Fasting Lipid Panel: No results for input(s): "CHOL", "HDL", "LDLCALC", "TRIG", "CHOLHDL", "LDLDIRECT" in the last 72 hours. Thyroid Function Tests: No results for input(s): "TSH", "T4TOTAL", "T3FREE", "THYROIDAB" in the last 72 hours.  Invalid input(s): "FREET3" Anemia Panel: No results for input(s): "VITAMINB12", "FOLATE", "FERRITIN", "TIBC", "IRON", "RETICCTPCT" in the last 72 hours.  CARDIAC CATHETERIZATION  Result Date: 01/28/2023   Prox RCA lesion is 20% stenosed.   Prox LAD lesion is 30% stenosed.   Mid LAD-1 lesion is 60% stenosed.   Mid LAD-2 lesion is 50% stenosed.   1st Mrg-1 lesion is 100% stenosed.   1st Mrg-2 lesion is 70% stenosed.   A drug-eluting stent was successfully placed using a STENT ONYX FRONTIER 2.5X22.   A drug-eluting stent was successfully placed using a STENT ONYX FRONTIER 2.25X18.   Post intervention, there is a 0% residual stenosis.   Post intervention, there is a 0% residual stenosis.   There is moderate left ventricular systolic dysfunction.   The left ventricular ejection fraction is  35-45% by visual estimate. 1.  NSTEMI 2.  Occluded OM1 (culprit lesion), sequential 60 and 50% stenosis mid LAD 3.  Moderate reduced left ventricular function with estimated LV ejection fraction of 35 to 40% with apical akinesis 4.  Successful PCI with overlapping DES OM1 Recommendations 1.  Dual antiplatelet therapy uninterrupted x 1 year 2.  Aggressive risk factor modification   ECHOCARDIOGRAM COMPLETE  Result Date: 01/27/2023    ECHOCARDIOGRAM REPORT   Patient Name:   Katie Beltran Date of Exam: 01/27/2023 Medical Rec #:  [id number]     Height:       62.0 in Accession #:    [id number]    Weight:       157.0 lb Date of Birth:  02-13-1941    BSA:          1.725 m Patient Age:    82 years      BP:           129/84 mmHg Patient Gender: F             HR:           94 bpm. Exam Location:  [facility] Procedure: 2D Echo, Cardiac Doppler and Color Doppler Indications:     Elevated troponin   History:         Patient has no prior history of Echocardiogram examinations.                  Risk Factors:Dyslipidemia.  Sonographer:     Cristela Blue Referring Phys:  [id number] [facility] MICHELLE TANG Diagnosing Phys: Marcina Millard MD IMPRESSIONS  1. Left ventricular ejection fraction, by estimation, is 35 to 40%. The left ventricle has moderately decreased function. The left ventricle demonstrates regional wall motion abnormalities (see scoring diagram/findings for description). Left ventricular  diastolic parameters are consistent with Grade I diastolic dysfunction (impaired relaxation).  2. Right ventricular systolic function is normal. The right ventricular size is normal.  3. The mitral valve is normal in structure. Mild to moderate mitral valve regurgitation. No evidence of mitral stenosis.  4. Tricuspid valve regurgitation is moderate to severe.  5. The aortic valve is normal in structure. Aortic valve regurgitation is not visualized. Mild to moderate aortic valve stenosis.  6. The inferior vena cava is normal in size with greater than 50% respiratory variability, suggesting right atrial pressure of 3 mmHg. FINDINGS  Left Ventricle: Left ventricular ejection fraction, by estimation, is 35 to 40%. The left ventricle has moderately decreased function. The left ventricle demonstrates regional wall motion abnormalities. The left ventricular internal cavity size was normal in size. There is no left ventricular hypertrophy. Left ventricular diastolic parameters are consistent with Grade I diastolic dysfunction (impaired relaxation).  LV Wall Scoring: The apical lateral segment, apical septal segment, and apex are hypokinetic. Right Ventricle: The right ventricular size is normal. No increase in right ventricular wall thickness. Right ventricular systolic function is normal. Left Atrium: Left atrial size was normal in size. Right Atrium: Right atrial size was normal in size. Pericardium: There is no evidence of  pericardial effusion. Mitral Valve: The mitral valve is normal in structure. Mild to moderate mitral valve regurgitation. No evidence of mitral valve stenosis. MV peak gradient, 4.4 mmHg. The mean mitral valve gradient is 2.0 mmHg. Tricuspid Valve: The tricuspid valve is normal in structure. Tricuspid valve regurgitation is moderate to severe. No evidence of tricuspid stenosis. Aortic Valve: The aortic valve is normal in structure. Aortic valve regurgitation is not visualized.  Mild to moderate aortic stenosis is present. Aortic valve mean gradient measures 9.5 mmHg. Aortic valve peak gradient measures 15.9 mmHg. Aortic valve area, by VTI measures 1.26 cm. Pulmonic Valve: The pulmonic valve was normal in structure. Pulmonic valve regurgitation is not visualized. No evidence of pulmonic stenosis. Aorta: The aortic root is normal in size and structure. Venous: The inferior vena cava is normal in size with greater than 50% respiratory variability, suggesting right atrial pressure of 3 mmHg. IAS/Shunts: No atrial level shunt detected by color flow Doppler.  LEFT VENTRICLE PLAX 2D LVIDd:         4.60 cm     Diastology LVIDs:         3.40 cm     LV e' medial:    2.61 cm/s LV PW:         1.20 cm     LV E/e' medial:  33.3 LV IVS:        1.50 cm     LV e' lateral:   8.27 cm/s LVOT diam:     2.10 cm     LV E/e' lateral: 10.5 LV SV:         42 LV SV Index:   24 LVOT Area:     3.46 cm  LV Volumes (MOD) LV vol d, MOD A2C: 93.2 ml LV vol d, MOD A4C: 92.0 ml LV vol s, MOD A2C: 73.5 ml LV vol s, MOD A4C: 52.7 ml LV SV MOD A2C:     19.7 ml LV SV MOD A4C:     92.0 ml LV SV MOD BP:      29.0 ml RIGHT VENTRICLE RV Basal diam:  3.40 cm RV Mid diam:    2.50 cm LEFT ATRIUM           Index        RIGHT ATRIUM           Index LA diam:      3.40 cm 1.97 cm/m   RA Area:     13.30 cm LA Vol (A4C): 26.2 ml 15.19 ml/m  RA Volume:   29.80 ml  17.28 ml/m  AORTIC VALVE AV Area (Vmax):    1.14 cm AV Area (Vmean):   1.06 cm AV Area (VTI):      1.26 cm AV Vmax:           199.50 cm/s AV Vmean:          142.250 cm/s AV VTI:            0.334 m AV Peak Grad:      15.9 mmHg AV Mean Grad:      9.5 mmHg LVOT Vmax:         65.50 cm/s LVOT Vmean:        43.700 cm/s LVOT VTI:          0.122 m LVOT/AV VTI ratio: 0.36  AORTA Ao Root diam: 3.00 cm MITRAL VALVE                TRICUSPID VALVE MV Area (PHT): 5.58 cm     TR Peak grad:   37.5 mmHg MV Area VTI:   2.27 cm     TR Vmax:        306.00 cm/s MV Peak grad:  4.4 mmHg MV Mean grad:  2.0 mmHg     SHUNTS MV Vmax:       1.05 m/s     Systemic VTI:  0.12 m MV  Vmean:      70.9 cm/s    Systemic Diam: 2.10 cm MV Decel Time: 136 msec MV E velocity: 87.00 cm/s MV A velocity: 110.00 cm/s MV E/A ratio:  0.79 Marcina Millard MD Electronically signed by Marcina Millard MD Signature Date/Time: 01/27/2023/4:20:13 PM    Final      Echo LVEF 35-40% with anterior and apical hypokinesis with moderate to severe tricuspid regurgitation  TELEMETRY: Atrial fibrillation at 100 bpm:  ASSESSMENT AND PLAN:  Principal Problem:   NSTEMI (non-ST elevated myocardial infarction) (HCC) Active Problems:   GERD without esophagitis   Pure hypercholesterolemia   Hypertensive urgency   COVID-19 virus infection   Leukocytosis    1. NSTEMI (high-sensitivity troponin 1390, 1730, 5376, 6030, 7643, 11,976, 11,691, 14,132, 15,765), with typical and atypical chest pain.  Cardiac catheterization 01/28/2023 revealed occluded OM1.  Patient underwent successful PCI receiving overlapping DES OM1. 2.  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  COVID-19 illness.  Patient contracted COVID-19 while trip to [place] 01/01/2023 with fever and chills, remains positive 4.  Possible myocarditis/pericarditis, pleuritic chest pain improved on colchicine 5.  Hypertensive urgency upon presentation, blood pressure in normal range 6.  Multiple drug intolerances 7.  Atrial fibrillation,  asymptomatic, rate controlled   Recommendations   1.  Dual antiplatelet therapy uninterrupted x 1 year 2.  Add Eliquis 5 mg twice daily, DC aspirin in 2 weeks 3.  Continue low-dose metoprolol succinate 25 mg daily 4.  Ambulate today 5.  If patient continues to do well consider discharge later today 6.  Follow-up with Dr. Juliann Pares in 1 to 2 weeks   Marcina Millard, MD, PhD, [facility] 01/29/2023 8:29 AM

## 2023-01-31 ENCOUNTER — Encounter: Payer: Self-pay | Admitting: Cardiology

## 2023-02-01 ENCOUNTER — Emergency Department
Admission: EM | Admit: 2023-02-01 | Discharge: 2023-02-01 | Disposition: A | Discharge status: Home / Self Care | Payer: Medicare HMO | Attending: Emergency Medicine | Admitting: Emergency Medicine

## 2023-02-01 ENCOUNTER — Other Ambulatory Visit: Payer: Self-pay

## 2023-02-01 DIAGNOSIS — I739 Peripheral vascular disease, unspecified: Secondary | ICD-10-CM | POA: Diagnosis not present

## 2023-02-01 DIAGNOSIS — Z955 Presence of coronary angioplasty implant and graft: Secondary | ICD-10-CM | POA: Diagnosis not present

## 2023-02-01 DIAGNOSIS — I2510 Atherosclerotic heart disease of native coronary artery without angina pectoris: Secondary | ICD-10-CM | POA: Diagnosis not present

## 2023-02-01 DIAGNOSIS — I7300 Raynaud's syndrome without gangrene: Secondary | ICD-10-CM | POA: Diagnosis not present

## 2023-02-01 DIAGNOSIS — D72829 Elevated white blood cell count, unspecified: Secondary | ICD-10-CM | POA: Insufficient documentation

## 2023-02-01 DIAGNOSIS — R202 Paresthesia of skin: Secondary | ICD-10-CM | POA: Diagnosis present

## 2023-02-01 LAB — BASIC METABOLIC PANEL
Anion gap: 8 (ref 5–15)
BUN: 15 mg/dL (ref 8–23)
CO2: 25 mmol/L (ref 22–32)
Calcium: 8.6 mg/dL — ABNORMAL LOW (ref 8.9–10.3)
Chloride: 101 mmol/L (ref 98–111)
Creatinine, Ser: 0.61 mg/dL (ref 0.44–1.00)
GFR, Estimated: >60 mL/min (ref >60)
Glucose, Bld: 121 mg/dL — ABNORMAL HIGH (ref 70–99)
Potassium: 3.7 mmol/L (ref 3.5–5.1)
Sodium: 134 mmol/L — ABNORMAL LOW (ref 135–145)

## 2023-02-01 LAB — CBC
HCT: 37.9 % (ref 36.0–46.0)
Hemoglobin: 12.3 g/dL (ref 12.0–15.0)
MCH: 28 pg (ref 26.0–34.0)
MCHC: 32.5 g/dL (ref 30.0–36.0)
MCV: 86.3 fL (ref 80.0–100.0)
Platelets: 319 10*3/uL (ref 150–400)
RBC: 4.39 MIL/uL (ref 3.87–5.11)
RDW: 15.2 % (ref 11.5–15.5)
WBC: 11.7 10*3/uL — ABNORMAL HIGH (ref 4.0–10.5)
nRBC: 0 % (ref 0.0–0.2)

## 2023-02-01 NOTE — ED Provider Notes (Signed)
[facility] Provider Note    Event Date/Time   First MD Initiated Contact with Patient 02/01/23 2100     (approximate)   History   Chief Complaint Numbness   HPI  Katie Beltran is a 82 y.o. female with past medical history of hypertension, hyperlipidemia, and CAD who presents to the ED complaining of numbness.  Patient underwent cardiac catheterization with stent placement 4 days ago.  Today about 3 hours prior to arrival, she began to notice some numbness and tingling in the thumb, index finger, and middle finger of the right hand where her radial artery was accessed for catheterization.  Symptoms persisted intermittently for about an hour and she occasionally noticed some blue discoloration of the tips of her index finger and middle finger.  She states the hand was slightly painful and she eventually decided to go to urgent care, subsequently referred to the ED.  She now states that she has not had any symptoms in her hand has felt back to normal for the past hour.  She has not noticed any redness or swelling to the access site.      Physical Exam   Triage Vital Signs: ED Triage Vitals  Encounter Vitals Group     BP 02/01/23 2030 130/89     Systolic BP Percentile --      Diastolic BP Percentile --      Pulse Rate 02/01/23 2030 100     Resp 02/01/23 2030 16     Temp 02/01/23 2030 97.8 F (36.6 C)     Temp src --      SpO2 02/01/23 2030 99 %     Weight --      Height --      Head Circumference --      Peak Flow --      Pain Score 02/01/23 2027 0     Pain Loc --      Pain Education --      Exclude from Growth Chart --     Most recent vital signs: Vitals:   02/01/23 2030  BP: 130/89  Pulse: 100  Resp: 16  Temp: 97.8 F (36.6 C)  SpO2: 99%    Constitutional: Alert and oriented. Eyes: Conjunctivae are normal. Head: Atraumatic. Nose: No congestion/rhinnorhea. Mouth/Throat: Mucous membranes are moist.  Cardiovascular: Normal rate,  regular rhythm. Grossly normal heart sounds.  2+ radial pulses bilaterally.  Radial artery access site of right wrist with some ecchymosis but no erythema, edema, or warmth noted.  Cap refill less than 2 seconds throughout digits of right hand. Respiratory: Normal respiratory effort.  No retractions. Lungs CTAB. Gastrointestinal: Soft and nontender. No distention. Musculoskeletal: No lower extremity tenderness nor edema.  Neurologic:  Normal speech and language. No gross focal neurologic deficits are appreciated.    ED Results / Procedures / Treatments   Labs (all labs ordered are listed, but only abnormal results are displayed) Labs Reviewed  CBC - Abnormal; Notable for the following components:      Result Value   WBC 11.7 (*)    All other components within normal limits  BASIC METABOLIC PANEL - Abnormal; Notable for the following components:   Sodium 134 (*)    Glucose, Bld 121 (*)    Calcium 8.6 (*)    All other components within normal limits     EKG  ED ECG REPORT I, Chesley Noon, the attending physician, personally viewed and interpreted this ECG.   Date: 02/01/2023  EKG Time: 20:35  Rate: 103  Rhythm: sinus tachycardia  Axis: Normal  Intervals: Incomplete RBBB  ST&T Change: None  PROCEDURES:  Critical Care performed: No  Procedures   MEDICATIONS ORDERED IN ED: Medications - No data to display   IMPRESSION / MDM / ASSESSMENT AND PLAN / ED COURSE  I reviewed the triage vital signs and the nursing notes.                              82 y.o. female with past medical history of hypertension, hyperlipidemia, and CAD who presents to the ED complaining of numbness, tingling, and discoloration of her right index and middle finger after radial artery access for heart catheterization 4 days ago.  Patient's presentation is most consistent with acute presentation with potential threat to life or bodily function.  Differential diagnosis includes, but is not  limited to, arterial insufficiency, arterial vasospasm, paresthesia, surgical site infection.  Patient nontoxic-appearing and in no acute distress, vital signs are unremarkable.  Radial artery access site appears to be healing well with some ecchymosis but no evidence of infection.  She has a strong radial pulse and cap refill less than 2 seconds throughout all digits of right hand.  She may have had some vasospasm earlier but is currently asymptomatic.  Plan to discuss with cardiology.  Labs are reassuring with no significant anemia, leukocytosis, electrolyte abnormality, or AKI.  Patient had another episode here in the ED that resolved with application of heating pad.  Case discussed with Dr. Melton Alar of cardiology, who states that the symptoms are not unexpected following cardiac catheterization with radial artery access.  Patient may alternate heat and ice and is appropriate for outpatient follow-up with cardiology.  She was counseled to return to the ED for new worsening symptoms, patient and family agree with plan.      FINAL CLINICAL IMPRESSION(S) / ED DIAGNOSES   Final diagnoses:  Vasospasm (HCC)  Raynaud's phenomenon without gangrene     Rx / DC Orders   ED Discharge Orders     None        Note:  This document was prepared using Dragon voice recognition software and may include unintentional dictation errors.   Chesley Noon, MD 02/01/23 2213

## 2023-02-01 NOTE — ED Triage Notes (Signed)
Pt arrives with her family, stating that today, she went to her cardiology appt for f/u after heart catherization with stents last week. She was given a new rx for lasix and started that today. Did have some dizziness when she got home from the office. This evening in the right hand she had some numbness in her fingers and they turned blue, which since symptoms have subsided. (Good radial pulse, no discoloration noted during triage)

## 2023-02-21 ENCOUNTER — Other Ambulatory Visit: Payer: Self-pay

## 2023-02-21 ENCOUNTER — Encounter: Payer: Medicare HMO | Attending: Internal Medicine

## 2023-02-21 DIAGNOSIS — I214 Non-ST elevation (NSTEMI) myocardial infarction: Secondary | ICD-10-CM

## 2023-02-21 DIAGNOSIS — Z955 Presence of coronary angioplasty implant and graft: Secondary | ICD-10-CM

## 2023-02-21 NOTE — Progress Notes (Signed)
Virtual Visit completed. Patient informed on EP and RD appointment and 6 Minute walk test. Patient also informed of patient health questionnaires on My Chart. Patient Verbalizes understanding. Visit diagnosis can be found in [facility] 01/27/2023.

## 2023-02-22 IMAGING — CT CT CHEST W/O CM
1 series · 15 of 34 positions shown, 19 images · non-contrast
Comparison: December 16, 2015.

CLINICAL DATA: Chronic cough.

EXAM:
CT CHEST WITHOUT CONTRAST
TECHNIQUE: Multidetector CT imaging of the chest was performed following the
standard protocol without IV contrast.

[Series 2: thorax · axial · 0.64mm/px · z∈[-668,-412]mm · 15 of 152 slices shown, 19 images]
[im 12/152  mediastinal]
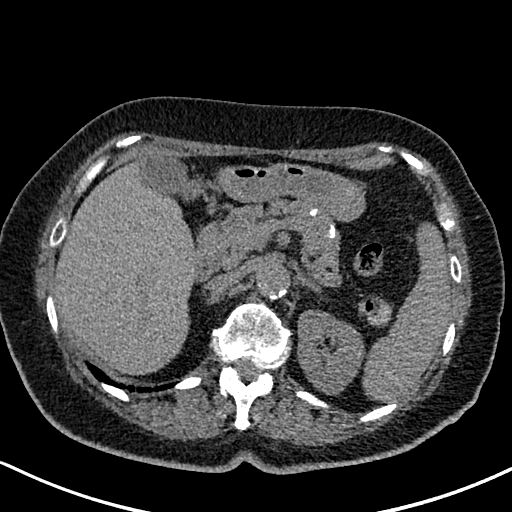
[im 12/152  lung]
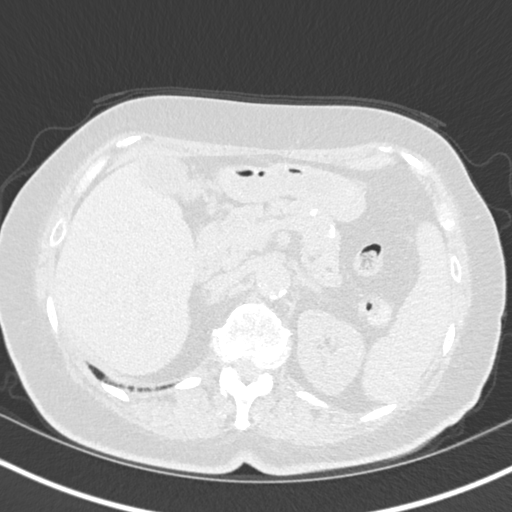
[im 23/152  lung]
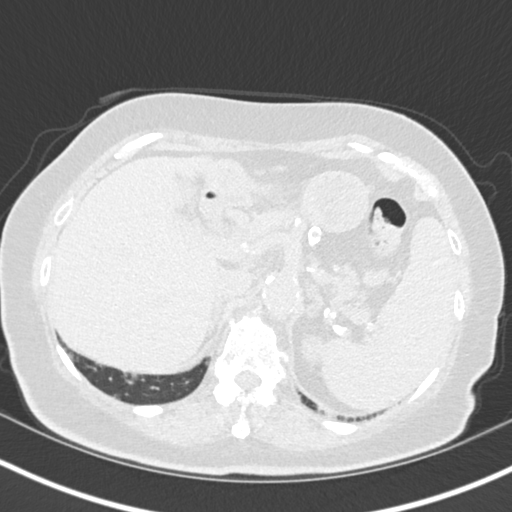
[im 31/152  lung]
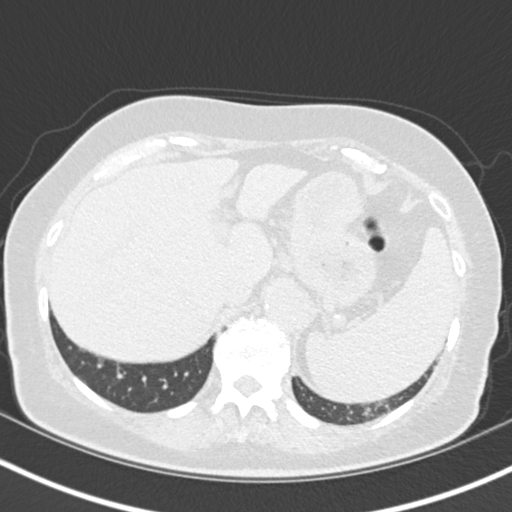
[im 40/152  lung]
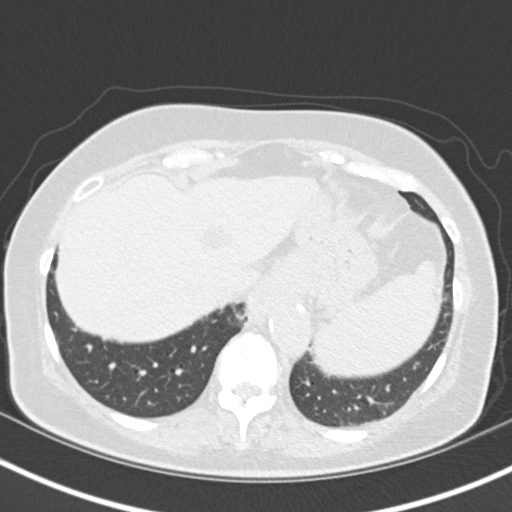
[im 51/152  mediastinal]
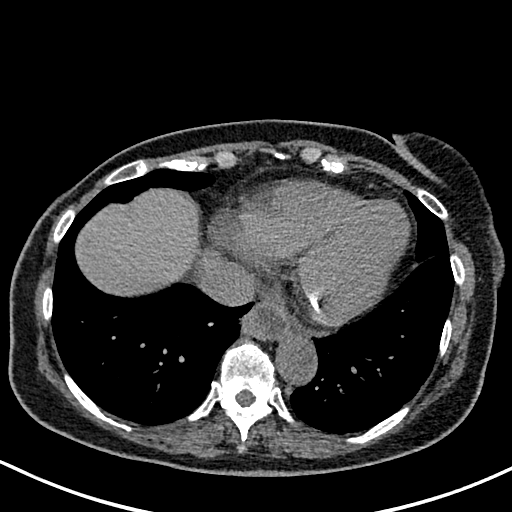
[im 51/152  lung]
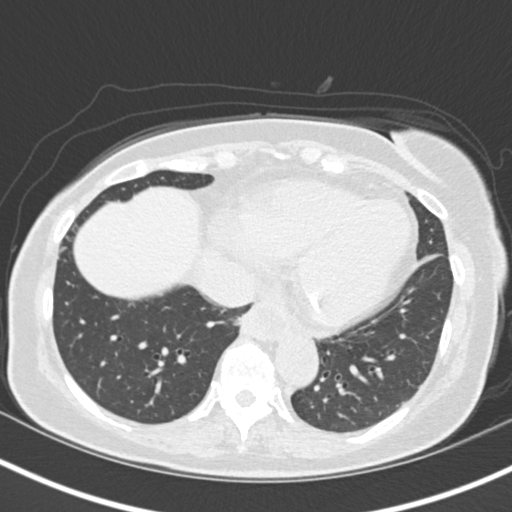
[im 61/152  lung]
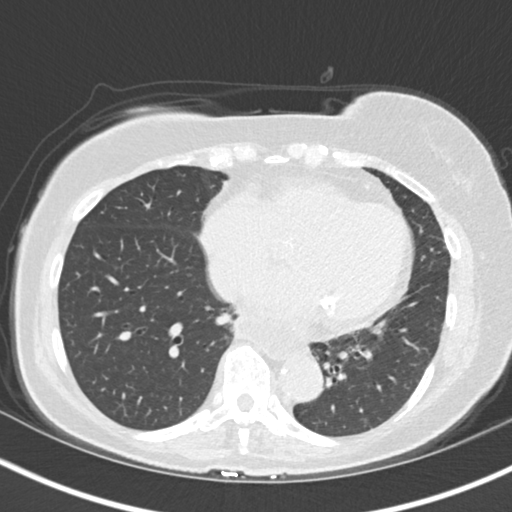
[im 68/152  lung]
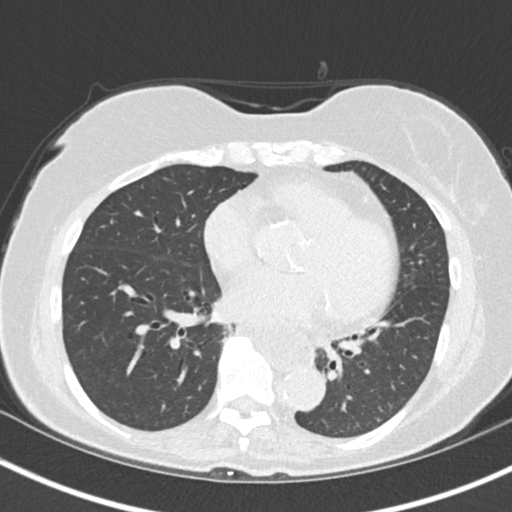
[im 79/152  lung]
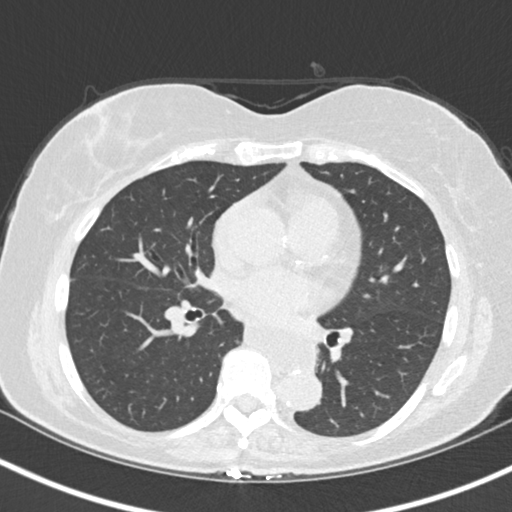
[im 84/152  mediastinal]
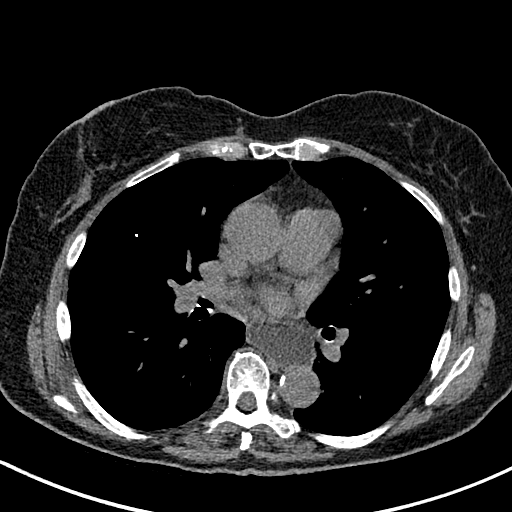
[im 84/152  lung]
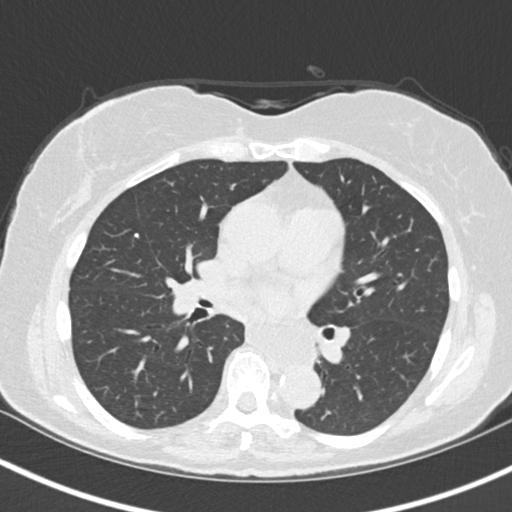
[im 91/152  lung]
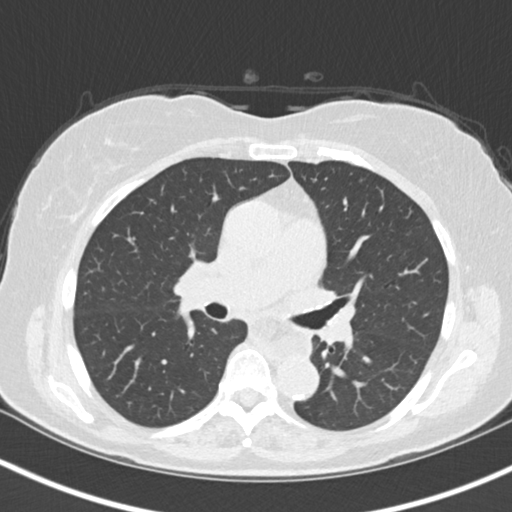
[im 101/152  lung]
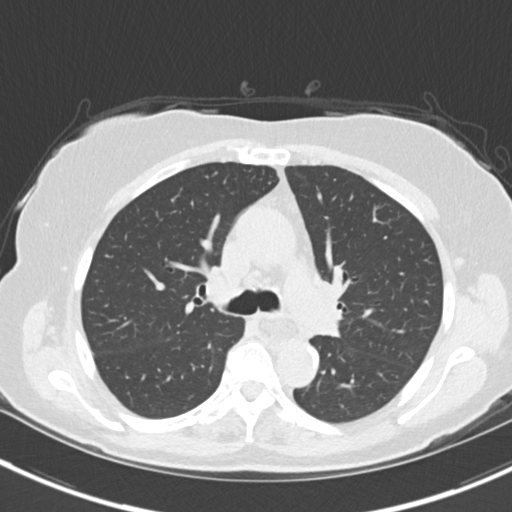
[im 112/152  lung]
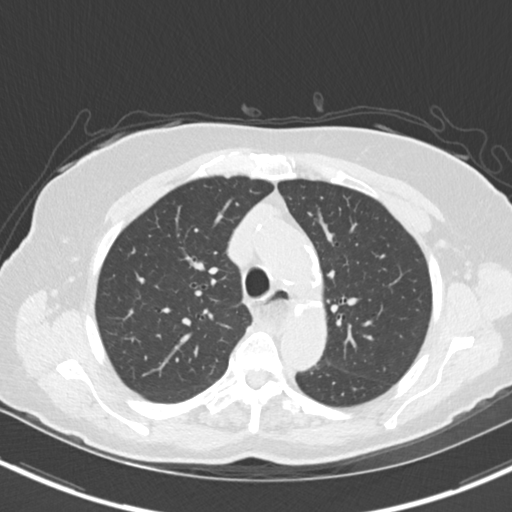
[im 121/152  mediastinal]
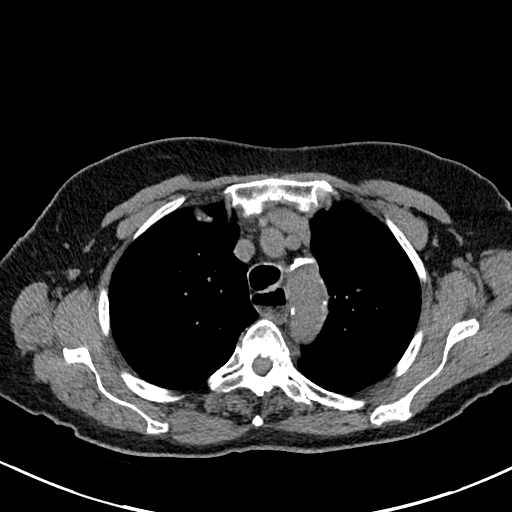
[im 121/152  lung]
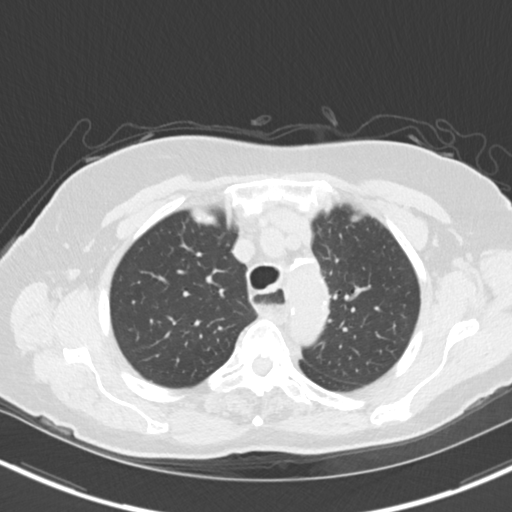
[im 129/152  lung]
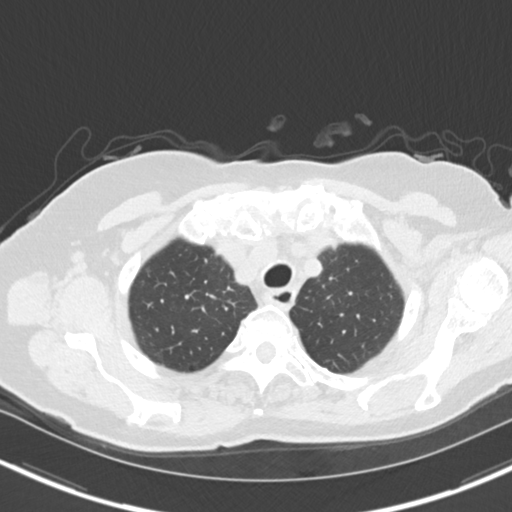
[im 140/152  lung]
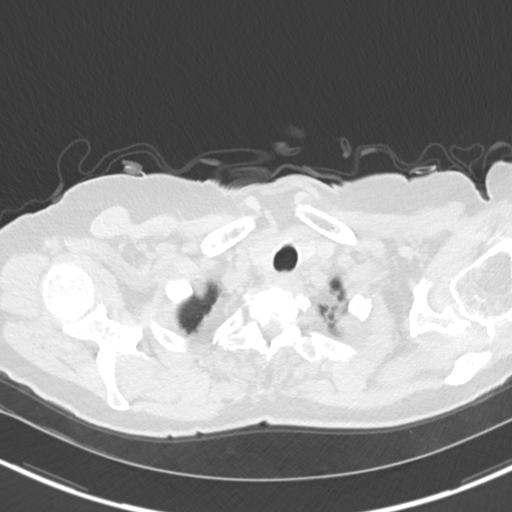

[15 of 34 positions shown; findings below may reference images not displayed]

FINDINGS: Cardiovascular: Atherosclerosis of thoracic aorta is noted without
aneurysm formation. Normal cardiac size. No pericardial effusion.
Mild coronary artery calcifications are noted.

Mediastinum/Nodes: Thyroid gland is unremarkable. No definite
adenopathy is noted. Stable dilated esophagus is noted.

Lungs/Pleura: No pneumothorax or pleural effusion is noted. Mild
bronchiectasis is noted in both lower lobes. Mild biapical scarring
is noted. No acute consolidative process is noted. Stable minimal
bibasilar scarring is noted.

Upper Abdomen: Cholelithiasis is noted.

Musculoskeletal: No chest wall mass or suspicious bone lesions
identified.
IMPRESSION: No acute pulmonary parenchymal abnormality is noted.

Mild bronchiectasis is noted in both lower lobes with minimal
bibasilar scarring.

Stable dilated esophagus is noted suggesting dysmotility or possibly
distal esophageal obstruction.

Mild coronary artery calcifications are noted.

Cholelithiasis.

Aortic Atherosclerosis ([id number]).

## 2023-03-03 ENCOUNTER — Encounter: Payer: Medicare HMO | Attending: Internal Medicine

## 2023-03-03 VITALS — Ht 62.5 in | Wt 152.4 lb

## 2023-03-03 DIAGNOSIS — Z48812 Encounter for surgical aftercare following surgery on the circulatory system: Secondary | ICD-10-CM | POA: Insufficient documentation

## 2023-03-03 DIAGNOSIS — Z955 Presence of coronary angioplasty implant and graft: Secondary | ICD-10-CM | POA: Insufficient documentation

## 2023-03-03 DIAGNOSIS — I252 Old myocardial infarction: Secondary | ICD-10-CM | POA: Diagnosis not present

## 2023-03-03 DIAGNOSIS — I214 Non-ST elevation (NSTEMI) myocardial infarction: Secondary | ICD-10-CM

## 2023-03-03 NOTE — Patient Instructions (Signed)
Patient Instructions  Patient Details  Name: Katie Beltran MRN: [id number] Date of Birth: 03/26/1941 Referring Provider:  Marcina Millard, MD  Below are your personal goals for exercise, nutrition, and risk factors. Our goal is to help you stay on track towards obtaining and maintaining these goals. We will be discussing your progress on these goals with you throughout the program.  Initial Exercise Prescription:  Initial Exercise Prescription - 03/03/23 1500       Date of Initial Exercise RX and Referring Provider   Date 03/03/23    Referring Provider Dr. Marcina Millard MD      Oxygen   Maintain Oxygen Saturation 88% or higher      Recumbant Bike   Level 1    RPM 50    Watts 15    Minutes 15    METs 1.29      NuStep   Level 1    SPM 80    Minutes 15    METs 1.29      Biostep-RELP   Level 1    SPM 50    Minutes 15    METs 1.29      Track   Laps 12    Minutes 15    METs 1.65      Prescription Details   Frequency (times per week) 2    Duration Progress to 30 minutes of continuous aerobic without signs/symptoms of physical distress      Intensity   THRR 40-80% of Max Heartrate 115-130    Ratings of Perceived Exertion 11-13    Perceived Dyspnea 0-4      Progression   Progression Continue to progress workloads to maintain intensity without signs/symptoms of physical distress.      Resistance Training   Training Prescription Yes    Weight 2 lb    Reps 10-15             Exercise Goals: Frequency: Be able to perform aerobic exercise two to three times per week in program working toward 2-5 days per week of home exercise.  Intensity: Work with a perceived exertion of 11 (fairly light) - 15 (hard) while following your exercise prescription.  We will make changes to your prescription with you as you progress through the program.   Duration: Be able to do 30 to 45 minutes of continuous aerobic exercise in addition to a 5 minute warm-up and a 5  minute cool-down routine.   Nutrition Goals: Your personal nutrition goals will be established when you do your nutrition analysis with the dietician.  The following are general nutrition guidelines to follow: Cholesterol < 200mg /day Sodium < 1500mg /day Fiber: Women over 50 yrs - 21 grams per day  Personal Goals:  Personal Goals and Risk Factors at Admission - 02/21/23 1111       Core Components/Risk Factors/Patient Goals on Admission    Weight Management Yes;Weight Maintenance    Intervention Weight Management: Develop a combined nutrition and exercise program designed to reach desired caloric intake, while maintaining appropriate intake of nutrient and fiber, sodium and fats, and appropriate energy expenditure required for the weight goal.;Weight Management: Provide education and appropriate resources to help participant work on and attain dietary goals.;Weight Management/Obesity: Establish reasonable short term and long term weight goals.    Expected Outcomes Short Term: Continue to assess and modify interventions until short term weight is achieved;Long Term: Adherence to nutrition and physical activity/exercise program aimed toward attainment of established weight goal;Weight Maintenance: Understanding of the  daily nutrition guidelines, which includes 25-35% calories from fat, 7% or less cal from saturated fats, less than 200mg  cholesterol, less than 1.5gm of sodium, & 5 or more servings of fruits and vegetables daily;Understanding recommendations for meals to include 15-35% energy as protein, 25-35% energy from fat, 35-60% energy from carbohydrates, less than 200mg  of dietary cholesterol, 20-35 gm of total fiber daily;Understanding of distribution of calorie intake throughout the day with the consumption of 4-5 meals/snacks    Heart Failure Yes    Intervention Provide a combined exercise and nutrition program that is supplemented with education, support and counseling about heart failure.  Directed toward relieving symptoms such as shortness of breath, decreased exercise tolerance, and extremity edema.    Expected Outcomes Improve functional capacity of life;Short term: Attendance in program 2-3 days a week with increased exercise capacity. Reported lower sodium intake. Reported increased fruit and vegetable intake. Reports medication compliance.;Short term: Daily weights obtained and reported for increase. Utilizing diuretic protocols set by physician.;Long term: Adoption of self-care skills and reduction of barriers for early signs and symptoms recognition and intervention leading to self-care maintenance.    Hypertension Yes    Intervention Provide education on lifestyle modifcations including regular physical activity/exercise, weight management, moderate sodium restriction and increased consumption of fresh fruit, vegetables, and low fat dairy, alcohol moderation, and smoking cessation.;Monitor prescription use compliance.    Expected Outcomes Short Term: Continued assessment and intervention until BP is < 140/88mm HG in hypertensive participants. < 130/16mm HG in hypertensive participants with diabetes, heart failure or chronic kidney disease.;Long Term: Maintenance of blood pressure at goal levels.    Lipids Yes    Intervention Provide education and support for participant on nutrition & aerobic/resistive exercise along with prescribed medications to achieve LDL 70mg , HDL >40mg .    Expected Outcomes Short Term: Participant states understanding of desired cholesterol values and is compliant with medications prescribed. Participant is following exercise prescription and nutrition guidelines.;Long Term: Cholesterol controlled with medications as prescribed, with individualized exercise RX and with personalized nutrition plan. Value goals: LDL < 70mg , HDL > 40 mg.            Exercise Goals and Review:  Exercise Goals     Row Name 03/03/23 1551             Exercise Goals    Increase Physical Activity Yes       Intervention Develop an individualized exercise prescription for aerobic and resistive training based on initial evaluation findings, risk stratification, comorbidities and participant's personal goals.;Provide advice, education, support and counseling about physical activity/exercise needs.       Expected Outcomes Short Term: Attend rehab on a regular basis to increase amount of physical activity.;Long Term: Add in home exercise to make exercise part of routine and to increase amount of physical activity.;Long Term: Exercising regularly at least 3-5 days a week.       Increase Strength and Stamina Yes       Intervention Provide advice, education, support and counseling about physical activity/exercise needs.;Develop an individualized exercise prescription for aerobic and resistive training based on initial evaluation findings, risk stratification, comorbidities and participant's personal goals.       Expected Outcomes Short Term: Perform resistance training exercises routinely during rehab and add in resistance training at home;Short Term: Increase workloads from initial exercise prescription for resistance, speed, and METs.;Long Term: Improve cardiorespiratory fitness, muscular endurance and strength as measured by increased METs and functional capacity ( )  Able to understand and use rate of perceived exertion (RPE) scale Yes       Intervention Provide education and explanation on how to use RPE scale       Expected Outcomes Short Term: Able to use RPE daily in rehab to express subjective intensity level;Long Term:  Able to use RPE to guide intensity level when exercising independently       Able to understand and use Dyspnea scale Yes       Intervention Provide education and explanation on how to use Dyspnea scale       Expected Outcomes Long Term: Able to use Dyspnea scale to guide intensity level when exercising independently;Short Term: Able to use  Dyspnea scale daily in rehab to express subjective sense of shortness of breath during exertion       Knowledge and understanding of Target Heart Rate Range (THRR) Yes       Intervention Provide education and explanation of THRR including how the numbers were predicted and where they are located for reference       Expected Outcomes Short Term: Able to state/look up THRR;Long Term: Able to use THRR to govern intensity when exercising independently;Short Term: Able to use daily as guideline for intensity in rehab       Able to check pulse independently Yes       Intervention Provide education and demonstration on how to check pulse in carotid and radial arteries.;Review the importance of being able to check your own pulse for safety during independent exercise       Expected Outcomes Short Term: Able to explain why pulse checking is important during independent exercise       Understanding of Exercise Prescription Yes       Intervention Provide education, explanation, and written materials on patient's individual exercise prescription       Expected Outcomes Short Term: Able to explain program exercise prescription;Long Term: Able to explain home exercise prescription to exercise independently

## 2023-03-03 NOTE — Progress Notes (Signed)
Cardiac Individual Treatment Plan  Patient Details  Name: Katie Beltran MRN: [id number] Date of Birth: [birth year] Referring Provider:   Flowsheet Row Cardiac Rehab from 03/03/2023 in [facility] Cardiac and Pulmonary Rehab  Referring Provider Dr. Marcina Millard MD       Initial Encounter Date:  Flowsheet Row Cardiac Rehab from 03/03/2023 in [facility] Cardiac and Pulmonary Rehab  Date 03/03/23       Visit Diagnosis: NSTEMI (non-ST elevated myocardial infarction) [facility])  Status post coronary artery stent placement  Patient's Home Medications on Admission:  Current Outpatient Medications:    acetaminophen (TYLENOL) 325 MG tablet, Take 2 tablets (650 mg total) by mouth every 4 (four) hours as needed for headache or mild pain., Disp: 20 tablet, Rfl: 0   apixaban (ELIQUIS) 5 MG TABS tablet, Take 1 tablet (5 mg total) by mouth 2 (two) times daily., Disp: 60 tablet, Rfl: 11   clopidogrel (PLAVIX) 75 MG tablet, Take 1 tablet (75 mg total) by mouth daily with breakfast., Disp: 30 tablet, Rfl: 11   famotidine (PEPCID) 20 MG tablet, Take 1 tablet (20 mg total) by mouth 2 (two) times daily., Disp: 30 tablet, Rfl: 0   furosemide (LASIX) 20 MG tablet, Take 20 mg by mouth daily., Disp: , Rfl:    losartan (COZAAR) 25 MG tablet, Take 1 tablet by mouth daily., Disp: , Rfl:    metoprolol succinate (TOPROL-XL) 25 MG 24 hr tablet, Take 1 tablet (25 mg total) by mouth daily., Disp: 30 tablet, Rfl: 0   metoprolol succinate (TOPROL-XL) 25 MG 24 hr tablet, Take 1 tablet by mouth daily. (Patient not taking: Reported on 02/21/2023), Disp: , Rfl:    Multiple Vitamin (MULTI-VITAMIN) tablet, Take 1 tablet by mouth daily., Disp: 30 tablet, Rfl: 0   REPATHA SURECLICK 140 MG/ML SOAJ, Inject into the skin., Disp: , Rfl:    rosuvastatin (CRESTOR) 5 MG tablet, Take 1 tablet (5 mg total) by mouth daily. (Patient not taking: Reported on 02/21/2023), Disp: 30 tablet, Rfl: 0   zinc gluconate 50 MG tablet, Take 1 tablet (50 mg total)  by mouth daily., Disp: 30 tablet, Rfl: 0  Past Medical History: Past Medical History:  Diagnosis Date   Acid reflux    Environmental allergies    Hypercholesteremia    Hypertension    Recurrent UTI     Tobacco Use: Social History   Tobacco Use  Smoking Status Never  Smokeless Tobacco Never    Labs: Review Flowsheet        No data to display           Exercise Target Goals: Exercise Program Goal: Individual exercise prescription set using results from initial 6 min walk test and THRR while considering  patient's activity barriers and safety.   Exercise Prescription Goal: Initial exercise prescription builds to 30-45 minutes a day of aerobic activity, 2-3 days per week.  Home exercise guidelines will be given to patient during program as part of exercise prescription that the participant will acknowledge.   Education: Aerobic Exercise: - Group verbal and visual presentation on the components of exercise prescription. Introduces F.I.T.T principle from ACSM for exercise prescriptions.  Reviews F.I.T.T. principles of aerobic exercise including progression. Written material given at graduation.   Education: Resistance Exercise: - Group verbal and visual presentation on the components of exercise prescription. Introduces F.I.T.T principle from ACSM for exercise prescriptions  Reviews F.I.T.T. principles of resistance exercise including progression. Written material given at graduation.    Education: Exercise & Equipment  Safety: - Individual verbal instruction and demonstration of equipment use and safety with use of the equipment. Flowsheet Row Cardiac Rehab from 02/21/2023 in [facility] Cardiac and Pulmonary Rehab  Date 02/21/23  Educator [facility]  Instruction Review Code 1- Verbalizes Understanding       Education: Exercise Physiology & General Exercise Guidelines: - Group verbal and written instruction with models to review the exercise physiology of the cardiovascular system  and associated critical values. Provides general exercise guidelines with specific guidelines to those with heart or lung disease.    Education: Flexibility, Balance, Mind/Body Relaxation: - Group verbal and visual presentation with interactive activity on the components of exercise prescription. Introduces F.I.T.T principle from ACSM for exercise prescriptions. Reviews F.I.T.T. principles of flexibility and balance exercise training including progression. Also discusses the mind body connection.  Reviews various relaxation techniques to help reduce and manage stress (i.e. Deep breathing, progressive muscle relaxation, and visualization). Balance handout provided to take home. Written material given at graduation.   Activity Barriers & Risk Stratification:  Activity Barriers & Cardiac Risk Stratification - 03/03/23 1551       Activity Barriers & Cardiac Risk Stratification   Activity Barriers Back Problems;Joint Problems;Other (comment)    Comments Knee swelling/pain    Cardiac Risk Stratification Moderate             6 Minute Walk:  6 Minute Walk     Row Name 03/03/23 1549         6 Minute Walk   Phase Initial     Distance 735 feet     Walk Time 6 minutes     # of Rest Breaks 0     MPH 1.39     METS 1.29     RPE 13     Perceived Dyspnea  0     VO2 Peak 4.53     Symptoms No  Knees swollen upon arrival     Resting HR 100 bpm     Resting BP 112/66     Resting Oxygen Saturation  99 %     Exercise Oxygen Saturation  during 6 min walk 99 %     Max Ex. HR 113 bpm     Max Ex. BP 130/72     2 Minute Post BP 108/64              Oxygen Initial Assessment:   Oxygen Re-Evaluation:   Oxygen Discharge (Final Oxygen Re-Evaluation):   Initial Exercise Prescription:  Initial Exercise Prescription - 03/03/23 1500       Date of Initial Exercise RX and Referring Provider   Date 03/03/23    Referring Provider Dr. Marcina Millard MD      Oxygen   Maintain Oxygen  Saturation 88% or higher      Recumbant Bike   Level 1    RPM 50    Watts 15    Minutes 15    METs 1.29      NuStep   Level 1    SPM 80    Minutes 15    METs 1.29      Biostep-RELP   Level 1    SPM 50    Minutes 15    METs 1.29      Track   Laps 12    Minutes 15    METs 1.65      Prescription Details   Frequency (times per week) 2    Duration Progress to 30 minutes of continuous  aerobic without signs/symptoms of physical distress      Intensity   THRR 40-80% of Max Heartrate 115-130    Ratings of Perceived Exertion 11-13    Perceived Dyspnea 0-4      Progression   Progression Continue to progress workloads to maintain intensity without signs/symptoms of physical distress.      Resistance Training   Training Prescription Yes    Weight 2 lb    Reps 10-15             Perform Capillary Blood Glucose checks as needed.  Exercise Prescription Changes:   Exercise Prescription Changes     Row Name 03/03/23 1500             Response to Exercise   Blood Pressure (Admit) 112/66       Blood Pressure (Exercise) 130/72       Blood Pressure (Exit) 108/64       Heart Rate (Admit) 100 bpm       Heart Rate (Exercise) 113 bpm       Heart Rate (Exit) 104 bpm       Oxygen Saturation (Admit) 99 %       Oxygen Saturation (Exercise) 99 %       Rating of Perceived Exertion (Exercise) 13       Perceived Dyspnea (Exercise) 0       Symptoms none       Comments Results                Exercise Comments:   Exercise Goals and Review:   Exercise Goals     Row Name 03/03/23 1551             Exercise Goals   Increase Physical Activity Yes       Intervention Develop an individualized exercise prescription for aerobic and resistive training based on initial evaluation findings, risk stratification, comorbidities and participant's personal goals.;Provide advice, education, support and counseling about physical activity/exercise needs.       Expected  Outcomes Short Term: Attend rehab on a regular basis to increase amount of physical activity.;Long Term: Add in home exercise to make exercise part of routine and to increase amount of physical activity.;Long Term: Exercising regularly at least 3-5 days a week.       Increase Strength and Stamina Yes       Intervention Provide advice, education, support and counseling about physical activity/exercise needs.;Develop an individualized exercise prescription for aerobic and resistive training based on initial evaluation findings, risk stratification, comorbidities and participant's personal goals.       Expected Outcomes Short Term: Perform resistance training exercises routinely during rehab and add in resistance training at home;Short Term: Increase workloads from initial exercise prescription for resistance, speed, and METs.;Long Term: Improve cardiorespiratory fitness, muscular endurance and strength as measured by increased METs and functional capacity ( )       Able to understand and use rate of perceived exertion (RPE) scale Yes       Intervention Provide education and explanation on how to use RPE scale       Expected Outcomes Short Term: Able to use RPE daily in rehab to express subjective intensity level;Long Term:  Able to use RPE to guide intensity level when exercising independently       Able to understand and use Dyspnea scale Yes       Intervention Provide education and explanation on how to use Dyspnea scale  Expected Outcomes Long Term: Able to use Dyspnea scale to guide intensity level when exercising independently;Short Term: Able to use Dyspnea scale daily in rehab to express subjective sense of shortness of breath during exertion       Knowledge and understanding of Target Heart Rate Range (THRR) Yes       Intervention Provide education and explanation of THRR including how the numbers were predicted and where they are located for reference       Expected Outcomes Short Term:  Able to state/look up THRR;Long Term: Able to use THRR to govern intensity when exercising independently;Short Term: Able to use daily as guideline for intensity in rehab       Able to check pulse independently Yes       Intervention Provide education and demonstration on how to check pulse in carotid and radial arteries.;Review the importance of being able to check your own pulse for safety during independent exercise       Expected Outcomes Short Term: Able to explain why pulse checking is important during independent exercise       Understanding of Exercise Prescription Yes       Intervention Provide education, explanation, and written materials on patient's individual exercise prescription       Expected Outcomes Short Term: Able to explain program exercise prescription;Long Term: Able to explain home exercise prescription to exercise independently                Exercise Goals Re-Evaluation :   Discharge Exercise Prescription (Final Exercise Prescription Changes):  Exercise Prescription Changes - 03/03/23 1500       Response to Exercise   Blood Pressure (Admit) 112/66    Blood Pressure (Exercise) 130/72    Blood Pressure (Exit) 108/64    Heart Rate (Admit) 100 bpm    Heart Rate (Exercise) 113 bpm    Heart Rate (Exit) 104 bpm    Oxygen Saturation (Admit) 99 %    Oxygen Saturation (Exercise) 99 %    Rating of Perceived Exertion (Exercise) 13    Perceived Dyspnea (Exercise) 0    Symptoms none    Comments Results             Nutrition:  Target Goals: Understanding of nutrition guidelines, daily intake of sodium 1500mg , cholesterol 200mg , calories 30% from fat and 7% or less from saturated fats, daily to have 5 or more servings of fruits and vegetables.  Education: All About Nutrition: -Group instruction provided by verbal, written material, interactive activities, discussions, models, and posters to present general guidelines for heart healthy nutrition including  fat, fiber, MyPlate, the role of sodium in heart healthy nutrition, utilization of the nutrition label, and utilization of this knowledge for meal planning. Follow up email sent as well. Written material given at graduation.   Biometrics:  Pre Biometrics - 03/03/23 1552       Pre Biometrics   Height 5' 2.5" (1.588 m)    Weight 152 lb 6.4 oz (69.1 kg)    Waist Circumference 33 inches    Hip Circumference 40 inches    Waist to Hip Ratio 0.83 %    BMI (Calculated) 27.41    Single Leg Stand 9.6 seconds              Nutrition Therapy Plan and Nutrition Goals:   Nutrition Assessments:  MEDIFICTS Score Key: ?70 Need to make dietary changes  40-70 Heart Healthy Diet ? 40 Therapeutic Level Cholesterol Diet   Picture  Your Plate Scores: <40 Unhealthy dietary pattern with much room for improvement. 41-50 Dietary pattern unlikely to meet recommendations for good health and room for improvement. 51-60 More healthful dietary pattern, with some room for improvement.  >60 Healthy dietary pattern, although there may be some specific behaviors that could be improved.    Nutrition Goals Re-Evaluation:   Nutrition Goals Discharge (Final Nutrition Goals Re-Evaluation):   Psychosocial: Target Goals: Acknowledge presence or absence of significant depression and/or stress, maximize coping skills, provide positive support system. Participant is able to verbalize types and ability to use techniques and skills needed for reducing stress and depression.   Education: Stress, Anxiety, and Depression - Group verbal and visual presentation to define topics covered.  Reviews how body is impacted by stress, anxiety, and depression.  Also discusses healthy ways to reduce stress and to treat/manage anxiety and depression.  Written material given at graduation.   Education: Sleep Hygiene -Provides group verbal and written instruction about how sleep can affect your health.  Define sleep hygiene,  discuss sleep cycles and impact of sleep habits. Review good sleep hygiene tips.    Initial Review & Psychosocial Screening:  Initial Psych Review & Screening - 02/21/23 1112       Initial Review   Current issues with Current Sleep Concerns    Comments Ludwika has had a bit of trouble sleeping. She feels half awake at times and is not getting adequate sleep. She can look to her three children and her husband for support.      Family Dynamics   Good Support System? Yes      Barriers   Psychosocial barriers to participate in program There are no identifiable barriers or psychosocial needs.;The patient should benefit from training in stress management and relaxation.      Screening Interventions   Interventions Encouraged to exercise;To provide support and resources with identified psychosocial needs;Provide feedback about the scores to participant    Expected Outcomes Short Term goal: Utilizing psychosocial counselor, staff and physician to assist with identification of specific Stressors or current issues interfering with healing process. Setting desired goal for each stressor or current issue identified.;Long Term Goal: Stressors or current issues are controlled or eliminated.;Short Term goal: Identification and review with participant of any Quality of Life or Depression concerns found by scoring the questionnaire.;Long Term goal: The participant improves quality of Life and PHQ9 Scores as seen by post scores and/or verbalization of changes             Quality of Life Scores:   Scores of 19 and below usually indicate a poorer quality of life in these areas.  A difference of  2-3 points is a clinically meaningful difference.  A difference of 2-3 points in the total score of the Quality of Life Index has been associated with significant improvement in overall quality of life, self-image, physical symptoms, and general health in studies assessing change in quality of life.  PHQ-9: Review  Flowsheet       03/03/2023  Depression screen PHQ 2/9  Decreased Interest 3  Down, Depressed, Hopeless 1  PHQ - 2 Score 4  Altered sleeping 0  Tired, decreased energy 1  Change in appetite 0  Feeling bad or failure about yourself  0  Trouble concentrating 1  Moving slowly or fidgety/restless 0  Suicidal thoughts 0  PHQ-9 Score 6  Difficult doing work/chores Somewhat difficult    Details           Interpretation  of Total Score  Total Score Depression Severity:  1-4 = Minimal depression, 5-9 = Mild depression, 10-14 = Moderate depression, 15-19 = Moderately severe depression, 20-27 = Severe depression   Psychosocial Evaluation and Intervention:  Psychosocial Evaluation - 02/21/23 1113       Psychosocial Evaluation & Interventions   Interventions Encouraged to exercise with the program and follow exercise prescription;Relaxation education;Stress management education    Comments Milena has had a bit of trouble sleeping. She feels half awake at times and is not getting adequate sleep. She can look to her three children and her husband for support.    Expected Outcomes Short: Start HeartTrack to help with mood. Long: Maintain a healthy mental state    Continue Psychosocial Services  Follow up required by staff             Psychosocial Re-Evaluation:   Psychosocial Discharge (Final Psychosocial Re-Evaluation):   Vocational Rehabilitation: Provide vocational rehab assistance to qualifying candidates.   Vocational Rehab Evaluation & Intervention:   Education: Education Goals: Education classes will be provided on a variety of topics geared toward better understanding of heart health and risk factor modification. Participant will state understanding/return demonstration of topics presented as noted by education test scores.  Learning Barriers/Preferences:  Learning Barriers/Preferences - 02/21/23 1110       Learning Barriers/Preferences   Learning Barriers None     Learning Preferences None             General Cardiac Education Topics:  AED/CPR: - Group verbal and written instruction with the use of models to demonstrate the basic use of the AED with the basic ABC's of resuscitation.   Anatomy and Cardiac Procedures: - Group verbal and visual presentation and models provide information about basic cardiac anatomy and function. Reviews the testing methods done to diagnose heart disease and the outcomes of the test results. Describes the treatment choices: Medical Management, Angioplasty, or Coronary Bypass Surgery for treating various heart conditions including Myocardial Infarction, Angina, Valve Disease, and Cardiac Arrhythmias.  Written material given at graduation.   Medication Safety: - Group verbal and visual instruction to review commonly prescribed medications for heart and lung disease. Reviews the medication, class of the drug, and side effects. Includes the steps to properly store meds and maintain the prescription regimen.  Written material given at graduation.   Intimacy: - Group verbal instruction through game format to discuss how heart and lung disease can affect sexual intimacy. Written material given at graduation..   Know Your Numbers and Heart Failure: - Group verbal and visual instruction to discuss disease risk factors for cardiac and pulmonary disease and treatment options.  Reviews associated critical values for Overweight/Obesity, Hypertension, Cholesterol, and Diabetes.  Discusses basics of heart failure: signs/symptoms and treatments.  Introduces Heart Failure Zone chart for action plan for heart failure.  Written material given at graduation.   Infection Prevention: - Provides verbal and written material to individual with discussion of infection control including proper hand washing and proper equipment cleaning during exercise session. Flowsheet Row Cardiac Rehab from 02/21/2023 in [facility] Cardiac and Pulmonary Rehab   Date 02/21/23  Educator [facility]  Instruction Review Code 1- Verbalizes Understanding       Falls Prevention: - Provides verbal and written material to individual with discussion of falls prevention and safety. Flowsheet Row Cardiac Rehab from 02/21/2023 in [facility] Cardiac and Pulmonary Rehab  Date 02/21/23  Educator [facility]  Instruction Review Code 1- Verbalizes Understanding  Other: -Provides group and verbal instruction on various topics (see comments)   Knowledge Questionnaire Score:   Core Components/Risk Factors/Patient Goals at Admission:  Personal Goals and Risk Factors at Admission - 02/21/23 1111       Core Components/Risk Factors/Patient Goals on Admission    Weight Management Yes;Weight Maintenance    Intervention Weight Management: Develop a combined nutrition and exercise program designed to reach desired caloric intake, while maintaining appropriate intake of nutrient and fiber, sodium and fats, and appropriate energy expenditure required for the weight goal.;Weight Management: Provide education and appropriate resources to help participant work on and attain dietary goals.;Weight Management/Obesity: Establish reasonable short term and long term weight goals.    Expected Outcomes Short Term: Continue to assess and modify interventions until short term weight is achieved;Long Term: Adherence to nutrition and physical activity/exercise program aimed toward attainment of established weight goal;Weight Maintenance: Understanding of the daily nutrition guidelines, which includes 25-35% calories from fat, 7% or less cal from saturated fats, less than 200mg  cholesterol, less than 1.5gm of sodium, & 5 or more servings of fruits and vegetables daily;Understanding recommendations for meals to include 15-35% energy as protein, 25-35% energy from fat, 35-60% energy from carbohydrates, less than 200mg  of dietary cholesterol, 20-35 gm of total fiber daily;Understanding of distribution of  calorie intake throughout the day with the consumption of 4-5 meals/snacks    Heart Failure Yes    Intervention Provide a combined exercise and nutrition program that is supplemented with education, support and counseling about heart failure. Directed toward relieving symptoms such as shortness of breath, decreased exercise tolerance, and extremity edema.    Expected Outcomes Improve functional capacity of life;Short term: Attendance in program 2-3 days a week with increased exercise capacity. Reported lower sodium intake. Reported increased fruit and vegetable intake. Reports medication compliance.;Short term: Daily weights obtained and reported for increase. Utilizing diuretic protocols set by physician.;Long term: Adoption of self-care skills and reduction of barriers for early signs and symptoms recognition and intervention leading to self-care maintenance.    Hypertension Yes    Intervention Provide education on lifestyle modifcations including regular physical activity/exercise, weight management, moderate sodium restriction and increased consumption of fresh fruit, vegetables, and low fat dairy, alcohol moderation, and smoking cessation.;Monitor prescription use compliance.    Expected Outcomes Short Term: Continued assessment and intervention until BP is < 140/3mm HG in hypertensive participants. < 130/58mm HG in hypertensive participants with diabetes, heart failure or chronic kidney disease.;Long Term: Maintenance of blood pressure at goal levels.    Lipids Yes    Intervention Provide education and support for participant on nutrition & aerobic/resistive exercise along with prescribed medications to achieve LDL 70mg , HDL >40mg .    Expected Outcomes Short Term: Participant states understanding of desired cholesterol values and is compliant with medications prescribed. Participant is following exercise prescription and nutrition guidelines.;Long Term: Cholesterol controlled with medications as  prescribed, with individualized exercise RX and with personalized nutrition plan. Value goals: LDL < 70mg , HDL > 40 mg.             Education:Diabetes - Individual verbal and written instruction to review signs/symptoms of diabetes, desired ranges of glucose level fasting, after meals and with exercise. Acknowledge that pre and post exercise glucose checks will be done for 3 sessions at entry of program.   Core Components/Risk Factors/Patient Goals Review:    Core Components/Risk Factors/Patient Goals at Discharge (Final Review):    ITP Comments:  ITP Comments     Row  Name 02/21/23 1110 03/03/23 1547         ITP Comments Virtual Visit completed. Patient informed on EP and RD appointment and 6 Minute walk test. Patient also informed of patient health questionnaires on My Chart. Patient Verbalizes understanding. Visit diagnosis can be found in [facility] 01/27/2023. Completed and gym orientation. Initial ITP created and sent for review to Dr. Bethann Punches, Medical Director.               Comments: Initial ITP

## 2023-03-08 ENCOUNTER — Encounter: Payer: Medicare HMO | Admitting: *Deleted

## 2023-03-08 DIAGNOSIS — Z955 Presence of coronary angioplasty implant and graft: Secondary | ICD-10-CM

## 2023-03-08 DIAGNOSIS — I214 Non-ST elevation (NSTEMI) myocardial infarction: Secondary | ICD-10-CM

## 2023-03-08 DIAGNOSIS — Z48812 Encounter for surgical aftercare following surgery on the circulatory system: Secondary | ICD-10-CM | POA: Diagnosis not present

## 2023-03-08 NOTE — Progress Notes (Signed)
Daily Session Note  Patient Details  Name: Katie Beltran MRN: [id number] Date of Birth: [birth year] Referring Provider:   Flowsheet Row Cardiac Rehab from 03/03/2023 in [facility]  Referring Provider Dr. Marcina Millard MD       Encounter Date: 03/08/2023  Check In:  Session Check In - 03/08/23 1038       Check-In   Supervising physician immediately available to respond to emergencies See telemetry face sheet for immediately available ER MD    Location [facility]    Staff Present Cora Collum, RN, BSN, CCRP;Noah Tickle, BS, Exercise Physiologist;Maxon Conetta BS, , Exercise Physiologist;Meredith Jewel Baize, RN BSN    Virtual Visit No    Medication changes reported     No    Fall or balance concerns reported    No    Warm-up and Cool-down Performed on first and last piece of equipment    Resistance Training Performed Yes    VAD Patient? No    PAD/SET Patient? No      Pain Assessment   Currently in Pain? No/denies                Social History   Tobacco Use  Smoking Status Never  Smokeless Tobacco Never    Goals Met:  Exercise tolerated well Personal goals reviewed No report of concerns or symptoms today  Goals Unmet:  Not Applicable  Comments: First full day of exercise!  Patient was oriented to gym and equipment including functions, settings, policies, and procedures.  Patient's individual exercise prescription and treatment plan were reviewed.  All starting workloads were established based on the results of the 6 minute walk test done at initial orientation visit.  The plan for exercise progression was also introduced and progression will be customized based on patient's performance and goals.    Dr. Bethann Punches is Medical Director for [facility].  Dr. Vida Rigger is Medical Director for [facility].

## 2023-03-22 ENCOUNTER — Encounter: Payer: Medicare HMO | Admitting: *Deleted

## 2023-03-24 ENCOUNTER — Encounter: Payer: Medicare HMO | Admitting: *Deleted

## 2023-03-30 ENCOUNTER — Encounter: Payer: Self-pay | Admitting: *Deleted

## 2023-03-30 DIAGNOSIS — I214 Non-ST elevation (NSTEMI) myocardial infarction: Secondary | ICD-10-CM

## 2023-03-30 DIAGNOSIS — Z955 Presence of coronary angioplasty implant and graft: Secondary | ICD-10-CM

## 2023-03-30 NOTE — Progress Notes (Signed)
Cardiac Individual Treatment Plan  Patient Details  Name: Katie Beltran MRN: [id number] Date of Birth: [birth year] Referring Provider:   Flowsheet Row Cardiac Rehab from 03/03/2023 in [facility] Cardiac and Pulmonary Rehab  Referring Provider Dr. Marcina Millard MD       Initial Encounter Date:  Flowsheet Row Cardiac Rehab from 03/03/2023 in [facility] Cardiac and Pulmonary Rehab  Date 03/03/23       Visit Diagnosis: Status post coronary artery stent placement  NSTEMI (non-ST elevated myocardial infarction) [facility])  Patient's Home Medications on Admission:  Current Outpatient Medications:    acetaminophen (TYLENOL) 325 MG tablet, Take 2 tablets (650 mg total) by mouth every 4 (four) hours as needed for headache or mild pain., Disp: 20 tablet, Rfl: 0   apixaban (ELIQUIS) 5 MG TABS tablet, Take 1 tablet (5 mg total) by mouth 2 (two) times daily., Disp: 60 tablet, Rfl: 11   clopidogrel (PLAVIX) 75 MG tablet, Take 1 tablet (75 mg total) by mouth daily with breakfast., Disp: 30 tablet, Rfl: 11   famotidine (PEPCID) 20 MG tablet, Take 1 tablet (20 mg total) by mouth 2 (two) times daily., Disp: 30 tablet, Rfl: 0   furosemide (LASIX) 20 MG tablet, Take 20 mg by mouth daily., Disp: , Rfl:    losartan (COZAAR) 25 MG tablet, Take 1 tablet by mouth daily., Disp: , Rfl:    metoprolol succinate (TOPROL-XL) 25 MG 24 hr tablet, Take 1 tablet (25 mg total) by mouth daily., Disp: 30 tablet, Rfl: 0   metoprolol succinate (TOPROL-XL) 25 MG 24 hr tablet, Take 1 tablet by mouth daily. (Patient not taking: Reported on 02/21/2023), Disp: , Rfl:    Multiple Vitamin (MULTI-VITAMIN) tablet, Take 1 tablet by mouth daily., Disp: 30 tablet, Rfl: 0   REPATHA SURECLICK 140 MG/ML SOAJ, Inject into the skin., Disp: , Rfl:    rosuvastatin (CRESTOR) 5 MG tablet, Take 1 tablet (5 mg total) by mouth daily. (Patient not taking: Reported on 02/21/2023), Disp: 30 tablet, Rfl: 0   zinc gluconate 50 MG tablet, Take 1 tablet (50 mg total)  by mouth daily., Disp: 30 tablet, Rfl: 0  Past Medical History: Past Medical History:  Diagnosis Date   Acid reflux    Environmental allergies    Hypercholesteremia    Hypertension    Recurrent UTI     Tobacco Use: Social History   Tobacco Use  Smoking Status Never  Smokeless Tobacco Never    Labs: Review Flowsheet        No data to display           Exercise Target Goals: Exercise Program Goal: Individual exercise prescription set using results from initial 6 min walk test and THRR while considering  patient's activity barriers and safety.   Exercise Prescription Goal: Initial exercise prescription builds to 30-45 minutes a day of aerobic activity, 2-3 days per week.  Home exercise guidelines will be given to patient during program as part of exercise prescription that the participant will acknowledge.   Education: Aerobic Exercise: - Group verbal and visual presentation on the components of exercise prescription. Introduces F.I.T.T principle from ACSM for exercise prescriptions.  Reviews F.I.T.T. principles of aerobic exercise including progression. Written material given at graduation.   Education: Resistance Exercise: - Group verbal and visual presentation on the components of exercise prescription. Introduces F.I.T.T principle from ACSM for exercise prescriptions  Reviews F.I.T.T. principles of resistance exercise including progression. Written material given at graduation.    Education: Exercise & Equipment  Safety: - Individual verbal instruction and demonstration of equipment use and safety with use of the equipment. Flowsheet Row Cardiac Rehab from 02/21/2023 in [facility] Cardiac and Pulmonary Rehab  Date 02/21/23  Educator [facility]  Instruction Review Code 1- Verbalizes Understanding       Education: Exercise Physiology & General Exercise Guidelines: - Group verbal and written instruction with models to review the exercise physiology of the cardiovascular system  and associated critical values. Provides general exercise guidelines with specific guidelines to those with heart or lung disease.    Education: Flexibility, Balance, Mind/Body Relaxation: - Group verbal and visual presentation with interactive activity on the components of exercise prescription. Introduces F.I.T.T principle from ACSM for exercise prescriptions. Reviews F.I.T.T. principles of flexibility and balance exercise training including progression. Also discusses the mind body connection.  Reviews various relaxation techniques to help reduce and manage stress (i.e. Deep breathing, progressive muscle relaxation, and visualization). Balance handout provided to take home. Written material given at graduation.   Activity Barriers & Risk Stratification:  Activity Barriers & Cardiac Risk Stratification - 03/03/23 1551       Activity Barriers & Cardiac Risk Stratification   Activity Barriers Back Problems;Joint Problems;Other (comment)    Comments Knee swelling/pain    Cardiac Risk Stratification Moderate             6 Minute Walk:  6 Minute Walk     Row Name 03/03/23 1549         6 Minute Walk   Phase Initial     Distance 735 feet     Walk Time 6 minutes     # of Rest Breaks 0     MPH 1.39     METS 1.29     RPE 13     Perceived Dyspnea  0     VO2 Peak 4.53     Symptoms No  Knees swollen upon arrival     Resting HR 100 bpm     Resting BP 112/66     Resting Oxygen Saturation  99 %     Exercise Oxygen Saturation  during 6 min walk 99 %     Max Ex. HR 113 bpm     Max Ex. BP 130/72     2 Minute Post BP 108/64              Oxygen Initial Assessment:   Oxygen Re-Evaluation:   Oxygen Discharge (Final Oxygen Re-Evaluation):   Initial Exercise Prescription:  Initial Exercise Prescription - 03/03/23 1500       Date of Initial Exercise RX and Referring Provider   Date 03/03/23    Referring Provider Dr. Marcina Millard MD      Oxygen   Maintain Oxygen  Saturation 88% or higher      Recumbant Bike   Level 1    RPM 50    Watts 15    Minutes 15    METs 1.29      NuStep   Level 1    SPM 80    Minutes 15    METs 1.29      Biostep-RELP   Level 1    SPM 50    Minutes 15    METs 1.29      Track   Laps 12    Minutes 15    METs 1.65      Prescription Details   Frequency (times per week) 2    Duration Progress to 30 minutes of continuous  aerobic without signs/symptoms of physical distress      Intensity   THRR 40-80% of Max Heartrate 115-130    Ratings of Perceived Exertion 11-13    Perceived Dyspnea 0-4      Progression   Progression Continue to progress workloads to maintain intensity without signs/symptoms of physical distress.      Resistance Training   Training Prescription Yes    Weight 2 lb    Reps 10-15             Perform Capillary Blood Glucose checks as needed.  Exercise Prescription Changes:   Exercise Prescription Changes     Row Name 03/03/23 1500 03/22/23 1400           Response to Exercise   Blood Pressure (Admit) 112/66 128/66      Blood Pressure (Exercise) 130/72 128/66      Blood Pressure (Exit) 108/64 120/68      Heart Rate (Admit) 100 bpm 92 bpm      Heart Rate (Exercise) 113 bpm 108 bpm      Heart Rate (Exit) 104 bpm 100 bpm      Oxygen Saturation (Admit) 99 % --      Oxygen Saturation (Exercise) 99 % --      Rating of Perceived Exertion (Exercise) 13 15      Perceived Dyspnea (Exercise) 0 --      Symptoms none --      Comments Results --      Duration -- Progress to 30 minutes of  aerobic without signs/symptoms of physical distress      Intensity -- THRR unchanged        Progression   Progression -- Continue to progress workloads to maintain intensity without signs/symptoms of physical distress.      Average METs -- 1.48        Resistance Training   Training Prescription -- Yes      Weight -- 2 lb      Reps -- 10-15        Recumbant Bike   Level -- 1      Watts  -- 15      Minutes -- 15      METs -- 1.4        NuStep   Level -- 1.4      Minutes -- 15      METs -- 1.4        Track   Laps -- 12      Minutes -- 15      METs -- 1.65               Exercise Comments:   Exercise Comments     Row Name 03/08/23 1039           Exercise Comments First full day of exercise!  Patient was oriented to gym and equipment including functions, settings, policies, and procedures.  Patient's individual exercise prescription and treatment plan were reviewed.  All starting workloads were established based on the results of the 6 minute walk test done at initial orientation visit.  The plan for exercise progression was also introduced and progression will be customized based on patient's performance and goals.                Exercise Goals and Review:   Exercise Goals     Row Name 03/03/23 1551             Exercise Goals  Increase Physical Activity Yes       Intervention Develop an individualized exercise prescription for aerobic and resistive training based on initial evaluation findings, risk stratification, comorbidities and participant's personal goals.;Provide advice, education, support and counseling about physical activity/exercise needs.       Expected Outcomes Short Term: Attend rehab on a regular basis to increase amount of physical activity.;Long Term: Add in home exercise to make exercise part of routine and to increase amount of physical activity.;Long Term: Exercising regularly at least 3-5 days a week.       Increase Strength and Stamina Yes       Intervention Provide advice, education, support and counseling about physical activity/exercise needs.;Develop an individualized exercise prescription for aerobic and resistive training based on initial evaluation findings, risk stratification, comorbidities and participant's personal goals.       Expected Outcomes Short Term: Perform resistance training exercises routinely during rehab  and add in resistance training at home;Short Term: Increase workloads from initial exercise prescription for resistance, speed, and METs.;Long Term: Improve cardiorespiratory fitness, muscular endurance and strength as measured by increased METs and functional capacity ( )       Able to understand and use rate of perceived exertion (RPE) scale Yes       Intervention Provide education and explanation on how to use RPE scale       Expected Outcomes Short Term: Able to use RPE daily in rehab to express subjective intensity level;Long Term:  Able to use RPE to guide intensity level when exercising independently       Able to understand and use Dyspnea scale Yes       Intervention Provide education and explanation on how to use Dyspnea scale       Expected Outcomes Long Term: Able to use Dyspnea scale to guide intensity level when exercising independently;Short Term: Able to use Dyspnea scale daily in rehab to express subjective sense of shortness of breath during exertion       Knowledge and understanding of Target Heart Rate Range (THRR) Yes       Intervention Provide education and explanation of THRR including how the numbers were predicted and where they are located for reference       Expected Outcomes Short Term: Able to state/look up THRR;Long Term: Able to use THRR to govern intensity when exercising independently;Short Term: Able to use daily as guideline for intensity in rehab       Able to check pulse independently Yes       Intervention Provide education and demonstration on how to check pulse in carotid and radial arteries.;Review the importance of being able to check your own pulse for safety during independent exercise       Expected Outcomes Short Term: Able to explain why pulse checking is important during independent exercise       Understanding of Exercise Prescription Yes       Intervention Provide education, explanation, and written materials on patient's individual exercise  prescription       Expected Outcomes Short Term: Able to explain program exercise prescription;Long Term: Able to explain home exercise prescription to exercise independently                Exercise Goals Re-Evaluation :  Exercise Goals Re-Evaluation     Row Name 03/08/23 1039 03/22/23 1435           Exercise Goal Re-Evaluation   Exercise Goals Review Able to understand and use rate of perceived exertion (  RPE) scale;Knowledge and understanding of Target Heart Rate Range (THRR);Able to understand and use Dyspnea scale;Understanding of Exercise Prescription Increase Physical Activity;Understanding of Exercise Prescription;Increase Strength and Stamina      Comments Reviewed RPE  and dyspnea scale, THR and program prescription with pt today.  Pt voiced understanding and was given a copy of goals to take home. Katie Beltran is off to a start in the program, she has completed one exercise session. We will continue to monitor her progress in the program.      Expected Outcomes Short: Use RPE daily to regulate intensity. Long: Follow program prescription in THR. Short: Continue to follow current exercise prescription. Long: Continue exercise to improve strength and stamina.               Discharge Exercise Prescription (Final Exercise Prescription Changes):  Exercise Prescription Changes - 03/22/23 1400       Response to Exercise   Blood Pressure (Admit) 128/66    Blood Pressure (Exercise) 128/66    Blood Pressure (Exit) 120/68    Heart Rate (Admit) 92 bpm    Heart Rate (Exercise) 108 bpm    Heart Rate (Exit) 100 bpm    Rating of Perceived Exertion (Exercise) 15    Duration Progress to 30 minutes of  aerobic without signs/symptoms of physical distress    Intensity THRR unchanged      Progression   Progression Continue to progress workloads to maintain intensity without signs/symptoms of physical distress.    Average METs 1.48      Resistance Training   Training Prescription Yes     Weight 2 lb    Reps 10-15      Recumbant Bike   Level 1    Watts 15    Minutes 15    METs 1.4      NuStep   Level 1.4    Minutes 15    METs 1.4      Track   Laps 12    Minutes 15    METs 1.65             Nutrition:  Target Goals: Understanding of nutrition guidelines, daily intake of sodium 1500mg , cholesterol 200mg , calories 30% from fat and 7% or less from saturated fats, daily to have 5 or more servings of fruits and vegetables.  Education: All About Nutrition: -Group instruction provided by verbal, written material, interactive activities, discussions, models, and posters to present general guidelines for heart healthy nutrition including fat, fiber, MyPlate, the role of sodium in heart healthy nutrition, utilization of the nutrition label, and utilization of this knowledge for meal planning. Follow up email sent as well. Written material given at graduation.   Biometrics:  Pre Biometrics - 03/03/23 1552       Pre Biometrics   Height 5' 2.5" (1.588 m)    Weight 152 lb 6.4 oz (69.1 kg)    Waist Circumference 33 inches    Hip Circumference 40 inches    Waist to Hip Ratio 0.83 %    BMI (Calculated) 27.41    Single Leg Stand 9.6 seconds              Nutrition Therapy Plan and Nutrition Goals:   Nutrition Assessments:  MEDIFICTS Score Key: >=70 Need to make dietary changes  40-70 Heart Healthy Diet <= 40 Therapeutic Level Cholesterol Diet  Flowsheet Row Cardiac Rehab from 03/08/2023 in [facility] Cardiac and Pulmonary Rehab  Picture Your Plate Total Score on Admission 75  Picture Your Plate Scores: <16 Unhealthy dietary pattern with much room for improvement. 41-50 Dietary pattern unlikely to meet recommendations for good health and room for improvement. 51-60 More healthful dietary pattern, with some room for improvement.  >60 Healthy dietary pattern, although there may be some specific behaviors that could be improved.    Nutrition Goals  Re-Evaluation:   Nutrition Goals Discharge (Final Nutrition Goals Re-Evaluation):   Psychosocial: Target Goals: Acknowledge presence or absence of significant depression and/or stress, maximize coping skills, provide positive support system. Participant is able to verbalize types and ability to use techniques and skills needed for reducing stress and depression.   Education: Stress, Anxiety, and Depression - Group verbal and visual presentation to define topics covered.  Reviews how body is impacted by stress, anxiety, and depression.  Also discusses healthy ways to reduce stress and to treat/manage anxiety and depression.  Written material given at graduation.   Education: Sleep Hygiene -Provides group verbal and written instruction about how sleep can affect your health.  Define sleep hygiene, discuss sleep cycles and impact of sleep habits. Review good sleep hygiene tips.    Initial Review & Psychosocial Screening:  Initial Psych Review & Screening - 02/21/23 1112       Initial Review   Current issues with Current Sleep Concerns    Comments Afia has had a bit of trouble sleeping. She feels half awake at times and is not getting adequate sleep. She can look to her three children and her husband for support.      Family Dynamics   Good Support System? Yes      Barriers   Psychosocial barriers to participate in program There are no identifiable barriers or psychosocial needs.;The patient should benefit from training in stress management and relaxation.      Screening Interventions   Interventions Encouraged to exercise;To provide support and resources with identified psychosocial needs;Provide feedback about the scores to participant    Expected Outcomes Short Term goal: Utilizing psychosocial counselor, staff and physician to assist with identification of specific Stressors or current issues interfering with healing process. Setting desired goal for each stressor or current issue  identified.;Long Term Goal: Stressors or current issues are controlled or eliminated.;Short Term goal: Identification and review with participant of any Quality of Life or Depression concerns found by scoring the questionnaire.;Long Term goal: The participant improves quality of Life and PHQ9 Scores as seen by post scores and/or verbalization of changes             Quality of Life Scores:   Quality of Life - 03/08/23 1130       Quality of Life   Select Quality of Life      Quality of Life Scores   Health/Function Pre 19.21 %    Socioeconomic Pre 21.93 %    Psych/Spiritual Pre 23.57 %    Family Pre 239 %    GLOBAL Pre 21.42 %            Scores of 19 and below usually indicate a poorer quality of life in these areas.  A difference of  2-3 points is a clinically meaningful difference.  A difference of 2-3 points in the total score of the Quality of Life Index has been associated with significant improvement in overall quality of life, self-image, physical symptoms, and general health in studies assessing change in quality of life.  PHQ-9: Review Flowsheet       03/03/2023  Depression screen [facility] 2/9  Decreased Interest 3  Down, Depressed, Hopeless 1  PHQ - 2 Score 4  Altered sleeping 0  Tired, decreased energy 1  Change in appetite 0  Feeling bad or failure about yourself  0  Trouble concentrating 1  Moving slowly or fidgety/restless 0  Suicidal thoughts 0  PHQ-9 Score 6  Difficult doing work/chores Somewhat difficult    Details           Interpretation of Total Score  Total Score Depression Severity:  1-4 = Minimal depression, 5-9 = Mild depression, 10-14 = Moderate depression, 15-19 = Moderately severe depression, 20-27 = Severe depression   Psychosocial Evaluation and Intervention:  Psychosocial Evaluation - 02/21/23 1113       Psychosocial Evaluation & Interventions   Interventions Encouraged to exercise with the program and follow exercise  prescription;Relaxation education;Stress management education    Comments Katie Beltran has had a bit of trouble sleeping. She feels half awake at times and is not getting adequate sleep. She can look to her three children and her husband for support.    Expected Outcomes Short: Start HeartTrack to help with mood. Long: Maintain a healthy mental state    Continue Psychosocial Services  Follow up required by staff             Psychosocial Re-Evaluation:   Psychosocial Discharge (Final Psychosocial Re-Evaluation):   Vocational Rehabilitation: Provide vocational rehab assistance to qualifying candidates.   Vocational Rehab Evaluation & Intervention:   Education: Education Goals: Education classes will be provided on a variety of topics geared toward better understanding of heart health and risk factor modification. Participant will state understanding/return demonstration of topics presented as noted by education test scores.  Learning Barriers/Preferences:  Learning Barriers/Preferences - 02/21/23 1110       Learning Barriers/Preferences   Learning Barriers None    Learning Preferences None             General Cardiac Education Topics:  AED/CPR: - Group verbal and written instruction with the use of models to demonstrate the basic use of the AED with the basic ABC's of resuscitation.   Anatomy and Cardiac Procedures: - Group verbal and visual presentation and models provide information about basic cardiac anatomy and function. Reviews the testing methods done to diagnose heart disease and the outcomes of the test results. Describes the treatment choices: Medical Management, Angioplasty, or Coronary Bypass Surgery for treating various heart conditions including Myocardial Infarction, Angina, Valve Disease, and Cardiac Arrhythmias.  Written material given at graduation.   Medication Safety: - Group verbal and visual instruction to review commonly prescribed medications for heart  and lung disease. Reviews the medication, class of the drug, and side effects. Includes the steps to properly store meds and maintain the prescription regimen.  Written material given at graduation.   Intimacy: - Group verbal instruction through game format to discuss how heart and lung disease can affect sexual intimacy. Written material given at graduation..   Know Your Numbers and Heart Failure: - Group verbal and visual instruction to discuss disease risk factors for cardiac and pulmonary disease and treatment options.  Reviews associated critical values for Overweight/Obesity, Hypertension, Cholesterol, and Diabetes.  Discusses basics of heart failure: signs/symptoms and treatments.  Introduces Heart Failure Zone chart for action plan for heart failure.  Written material given at graduation.   Infection Prevention: - Provides verbal and written material to individual with discussion of infection control including proper hand washing and proper equipment cleaning during exercise session. Flowsheet Row  Cardiac Rehab from 02/21/2023 in [facility] Cardiac and Pulmonary Rehab  Date 02/21/23  Educator [facility]  Instruction Review Code 1- Verbalizes Understanding       Falls Prevention: - Provides verbal and written material to individual with discussion of falls prevention and safety. Flowsheet Row Cardiac Rehab from 02/21/2023 in [facility] Cardiac and Pulmonary Rehab  Date 02/21/23  Educator [facility]  Instruction Review Code 1- Verbalizes Understanding       Other: -Provides group and verbal instruction on various topics (see comments)   Knowledge Questionnaire Score:  Knowledge Questionnaire Score - 03/08/23 1128       Knowledge Questionnaire Score   Pre Score 15/26             Core Components/Risk Factors/Patient Goals at Admission:  Personal Goals and Risk Factors at Admission - 02/21/23 1111       Core Components/Risk Factors/Patient Goals on Admission    Weight Management Yes;Weight  Maintenance    Intervention Weight Management: Develop a combined nutrition and exercise program designed to reach desired caloric intake, while maintaining appropriate intake of nutrient and fiber, sodium and fats, and appropriate energy expenditure required for the weight goal.;Weight Management: Provide education and appropriate resources to help participant work on and attain dietary goals.;Weight Management/Obesity: Establish reasonable short term and long term weight goals.    Expected Outcomes Short Term: Continue to assess and modify interventions until short term weight is achieved;Long Term: Adherence to nutrition and physical activity/exercise program aimed toward attainment of established weight goal;Weight Maintenance: Understanding of the daily nutrition guidelines, which includes 25-35% calories from fat, 7% or less cal from saturated fats, less than 200mg  cholesterol, less than 1.5gm of sodium, & 5 or more servings of fruits and vegetables daily;Understanding recommendations for meals to include 15-35% energy as protein, 25-35% energy from fat, 35-60% energy from carbohydrates, less than 200mg  of dietary cholesterol, 20-35 gm of total fiber daily;Understanding of distribution of calorie intake throughout the day with the consumption of 4-5 meals/snacks    Heart Failure Yes    Intervention Provide a combined exercise and nutrition program that is supplemented with education, support and counseling about heart failure. Directed toward relieving symptoms such as shortness of breath, decreased exercise tolerance, and extremity edema.    Expected Outcomes Improve functional capacity of life;Short term: Attendance in program 2-3 days a week with increased exercise capacity. Reported lower sodium intake. Reported increased fruit and vegetable intake. Reports medication compliance.;Short term: Daily weights obtained and reported for increase. Utilizing diuretic protocols set by physician.;Long term:  Adoption of self-care skills and reduction of barriers for early signs and symptoms recognition and intervention leading to self-care maintenance.    Hypertension Yes    Intervention Provide education on lifestyle modifcations including regular physical activity/exercise, weight management, moderate sodium restriction and increased consumption of fresh fruit, vegetables, and low fat dairy, alcohol moderation, and smoking cessation.;Monitor prescription use compliance.    Expected Outcomes Short Term: Continued assessment and intervention until BP is < 140/15mm HG in hypertensive participants. < 130/91mm HG in hypertensive participants with diabetes, heart failure or chronic kidney disease.;Long Term: Maintenance of blood pressure at goal levels.    Lipids Yes    Intervention Provide education and support for participant on nutrition & aerobic/resistive exercise along with prescribed medications to achieve LDL 70mg , HDL >40mg .    Expected Outcomes Short Term: Participant states understanding of desired cholesterol values and is compliant with medications prescribed. Participant is following exercise prescription and nutrition guidelines.;Long Term:  Cholesterol controlled with medications as prescribed, with individualized exercise RX and with personalized nutrition plan. Value goals: LDL < 70mg , HDL > 40 mg.             Education:Diabetes - Individual verbal and written instruction to review signs/symptoms of diabetes, desired ranges of glucose level fasting, after meals and with exercise. Acknowledge that pre and post exercise glucose checks will be done for 3 sessions at entry of program.   Core Components/Risk Factors/Patient Goals Review:    Core Components/Risk Factors/Patient Goals at Discharge (Final Review):    ITP Comments:  ITP Comments     Row Name 02/21/23 1110 03/03/23 1547 03/08/23 1039 03/30/23 1255     ITP Comments Virtual Visit completed. Patient informed on EP and RD  appointment and 6 Minute walk test. Patient also informed of patient health questionnaires on My Chart. Patient Verbalizes understanding. Visit diagnosis can be found in [facility] 01/27/2023. Completed and gym orientation. Initial ITP created and sent for review to Dr. Bethann Punches, Medical Director. First full day of exercise!  Patient was oriented to gym and equipment including functions, settings, policies, and procedures.  Patient's individual exercise prescription and treatment plan were reviewed.  All starting workloads were established based on the results of the 6 minute walk test done at initial orientation visit.  The plan for exercise progression was also introduced and progression will be customized based on patient's performance and goals. 30 Day review completed. Medical Director ITP review done, changes made as directed, and signed approval by Medical Director.    new to program             Comments:

## 2023-03-31 ENCOUNTER — Encounter: Payer: Medicare HMO | Admitting: *Deleted

## 2023-04-05 ENCOUNTER — Encounter: Payer: Medicare HMO | Attending: Internal Medicine

## 2023-04-05 DIAGNOSIS — I252 Old myocardial infarction: Secondary | ICD-10-CM | POA: Insufficient documentation

## 2023-04-05 DIAGNOSIS — Z48812 Encounter for surgical aftercare following surgery on the circulatory system: Secondary | ICD-10-CM | POA: Insufficient documentation

## 2023-04-05 DIAGNOSIS — Z955 Presence of coronary angioplasty implant and graft: Secondary | ICD-10-CM | POA: Insufficient documentation

## 2023-04-07 ENCOUNTER — Encounter: Payer: Medicare HMO | Admitting: *Deleted

## 2023-04-11 ENCOUNTER — Telehealth: Payer: Self-pay | Admitting: *Deleted

## 2023-04-11 ENCOUNTER — Encounter: Payer: Self-pay | Admitting: *Deleted

## 2023-04-11 NOTE — Telephone Encounter (Signed)
Called to check on Katie Beltran, Katie Beltran has not been to rehab since 9/10. Katie Beltran said Katie Beltran had called and spoke with staff to make aware Katie Beltran has been waiting to get scheduled with rheumatologist for her arthritis. Katie Beltran has had issues with getting this appointment scheduled. Katie Beltran tried to work out on a bike at home and was in sever pain the next day. Katie Beltran will need to be placed on medical hold until Katie Beltran sees Dr. Allena Katz with [facility] rheumatology. Katie Beltran is hoping to hear from them within the week. I have cancelled her appointments next week. Katie Beltran will let us know when Katie Beltran is scheduled. Otherwise, I told her I will follow up with her in a week if I have not heard back. Katie Beltran appreciative and voiced understanding.

## 2023-04-21 ENCOUNTER — Encounter: Payer: Self-pay | Admitting: *Deleted

## 2023-04-21 NOTE — Telephone Encounter (Signed)
Spoke with Olegario Messier. She has a rheumatology appointment scheduled 11/11. She does want to continue rehab if she is able. Will follow up with her after this appointment to discuss plan moving forward and determine if she will be able to resume rehab. Will keep her on medical hold until this appointment.

## 2023-04-27 ENCOUNTER — Encounter: Payer: Self-pay | Admitting: *Deleted

## 2023-04-27 DIAGNOSIS — I214 Non-ST elevation (NSTEMI) myocardial infarction: Secondary | ICD-10-CM

## 2023-04-27 DIAGNOSIS — Z955 Presence of coronary angioplasty implant and graft: Secondary | ICD-10-CM

## 2023-04-27 NOTE — Progress Notes (Signed)
Cardiac Individual Treatment Plan  Patient Details  Name: Katie Beltran MRN: [id number] Date of Birth: 05-Aug-1940 Referring Provider:   Flowsheet Row Cardiac Rehab from 03/03/2023 in [facility] Cardiac and Pulmonary Rehab  Referring Provider Dr. Marcina Millard MD       Initial Encounter Date:  Flowsheet Row Cardiac Rehab from 03/03/2023 in [facility] Cardiac and Pulmonary Rehab  Date 03/03/23       Visit Diagnosis: Status post coronary artery stent placement  NSTEMI (non-ST elevated myocardial infarction) [facility])  Patient's Home Medications on Admission:  Current Outpatient Medications:    acetaminophen (TYLENOL) 325 MG tablet, Take 2 tablets (650 mg total) by mouth every 4 (four) hours as needed for headache or mild pain., Disp: 20 tablet, Rfl: 0   apixaban (ELIQUIS) 5 MG TABS tablet, Take 1 tablet (5 mg total) by mouth 2 (two) times daily., Disp: 60 tablet, Rfl: 11   clopidogrel (PLAVIX) 75 MG tablet, Take 1 tablet (75 mg total) by mouth daily with breakfast., Disp: 30 tablet, Rfl: 11   famotidine (PEPCID) 20 MG tablet, Take 1 tablet (20 mg total) by mouth 2 (two) times daily., Disp: 30 tablet, Rfl: 0   furosemide (LASIX) 20 MG tablet, Take 20 mg by mouth daily., Disp: , Rfl:    losartan (COZAAR) 25 MG tablet, Take 1 tablet by mouth daily., Disp: , Rfl:    metoprolol succinate (TOPROL-XL) 25 MG 24 hr tablet, Take 1 tablet (25 mg total) by mouth daily., Disp: 30 tablet, Rfl: 0   metoprolol succinate (TOPROL-XL) 25 MG 24 hr tablet, Take 1 tablet by mouth daily. (Patient not taking: Reported on 02/21/2023), Disp: , Rfl:    Multiple Vitamin (MULTI-VITAMIN) tablet, Take 1 tablet by mouth daily., Disp: 30 tablet, Rfl: 0   REPATHA SURECLICK 140 MG/ML SOAJ, Inject into the skin., Disp: , Rfl:    rosuvastatin (CRESTOR) 5 MG tablet, Take 1 tablet (5 mg total) by mouth daily. (Patient not taking: Reported on 02/21/2023), Disp: 30 tablet, Rfl: 0   zinc gluconate 50 MG tablet, Take 1 tablet (50 mg total)  by mouth daily., Disp: 30 tablet, Rfl: 0  Past Medical History: Past Medical History:  Diagnosis Date   Acid reflux    Environmental allergies    Hypercholesteremia    Hypertension    Recurrent UTI     Tobacco Use: Social History   Tobacco Use  Smoking Status Never  Smokeless Tobacco Never    Labs: Review Flowsheet        No data to display           Exercise Target Goals: Exercise Program Goal: Individual exercise prescription set using results from initial 6 min walk test and THRR while considering  patient's activity barriers and safety.   Exercise Prescription Goal: Initial exercise prescription builds to 30-45 minutes a day of aerobic activity, 2-3 days per week.  Home exercise guidelines will be given to patient during program as part of exercise prescription that the participant will acknowledge.   Education: Aerobic Exercise: - Group verbal and visual presentation on the components of exercise prescription. Introduces F.I.T.T principle from ACSM for exercise prescriptions.  Reviews F.I.T.T. principles of aerobic exercise including progression. Written material given at graduation.   Education: Resistance Exercise: - Group verbal and visual presentation on the components of exercise prescription. Introduces F.I.T.T principle from ACSM for exercise prescriptions  Reviews F.I.T.T. principles of resistance exercise including progression. Written material given at graduation.    Education: Exercise & Equipment  Safety: - Individual verbal instruction and demonstration of equipment use and safety with use of the equipment. Flowsheet Row Cardiac Rehab from 02/21/2023 in [facility] Cardiac and Pulmonary Rehab  Date 02/21/23  Educator [facility]  Instruction Review Code 1- Verbalizes Understanding       Education: Exercise Physiology & General Exercise Guidelines: - Group verbal and written instruction with models to review the exercise physiology of the cardiovascular system  and associated critical values. Provides general exercise guidelines with specific guidelines to those with heart or lung disease.    Education: Flexibility, Balance, Mind/Body Relaxation: - Group verbal and visual presentation with interactive activity on the components of exercise prescription. Introduces F.I.T.T principle from ACSM for exercise prescriptions. Reviews F.I.T.T. principles of flexibility and balance exercise training including progression. Also discusses the mind body connection.  Reviews various relaxation techniques to help reduce and manage stress (i.e. Deep breathing, progressive muscle relaxation, and visualization). Balance handout provided to take home. Written material given at graduation.   Activity Barriers & Risk Stratification:  Activity Barriers & Cardiac Risk Stratification - 03/03/23 1551       Activity Barriers & Cardiac Risk Stratification   Activity Barriers Back Problems;Joint Problems;Other (comment)    Comments Knee swelling/pain    Cardiac Risk Stratification Moderate             6 Minute Walk:  6 Minute Walk     Row Name 03/03/23 1549         6 Minute Walk   Phase Initial     Distance 735 feet     Walk Time 6 minutes     # of Rest Breaks 0     MPH 1.39     METS 1.29     RPE 13     Perceived Dyspnea  0     VO2 Peak 4.53     Symptoms No  Knees swollen upon arrival     Resting HR 100 bpm     Resting BP 112/66     Resting Oxygen Saturation  99 %     Exercise Oxygen Saturation  during 6 min walk 99 %     Max Ex. HR 113 bpm     Max Ex. BP 130/72     2 Minute Post BP 108/64              Oxygen Initial Assessment:   Oxygen Re-Evaluation:   Oxygen Discharge (Final Oxygen Re-Evaluation):   Initial Exercise Prescription:  Initial Exercise Prescription - 03/03/23 1500       Date of Initial Exercise RX and Referring Provider   Date 03/03/23    Referring Provider Dr. Marcina Millard MD      Oxygen   Maintain Oxygen  Saturation 88% or higher      Recumbant Bike   Level 1    RPM 50    Watts 15    Minutes 15    METs 1.29      NuStep   Level 1    SPM 80    Minutes 15    METs 1.29      Biostep-RELP   Level 1    SPM 50    Minutes 15    METs 1.29      Track   Laps 12    Minutes 15    METs 1.65      Prescription Details   Frequency (times per week) 2    Duration Progress to 30 minutes of continuous  aerobic without signs/symptoms of physical distress      Intensity   THRR 40-80% of Max Heartrate 115-130    Ratings of Perceived Exertion 11-13    Perceived Dyspnea 0-4      Progression   Progression Continue to progress workloads to maintain intensity without signs/symptoms of physical distress.      Resistance Training   Training Prescription Yes    Weight 2 lb    Reps 10-15             Perform Capillary Blood Glucose checks as needed.  Exercise Prescription Changes:   Exercise Prescription Changes     Row Name 03/03/23 1500 03/22/23 1400           Response to Exercise   Blood Pressure (Admit) 112/66 128/66      Blood Pressure (Exercise) 130/72 128/66      Blood Pressure (Exit) 108/64 120/68      Heart Rate (Admit) 100 bpm 92 bpm      Heart Rate (Exercise) 113 bpm 108 bpm      Heart Rate (Exit) 104 bpm 100 bpm      Oxygen Saturation (Admit) 99 % --      Oxygen Saturation (Exercise) 99 % --      Rating of Perceived Exertion (Exercise) 13 15      Perceived Dyspnea (Exercise) 0 --      Symptoms none --      Comments Results --      Duration -- Progress to 30 minutes of  aerobic without signs/symptoms of physical distress      Intensity -- THRR unchanged        Progression   Progression -- Continue to progress workloads to maintain intensity without signs/symptoms of physical distress.      Average METs -- 1.48        Resistance Training   Training Prescription -- Yes      Weight -- 2 lb      Reps -- 10-15        Recumbant Bike   Level -- 1      Watts  -- 15      Minutes -- 15      METs -- 1.4        NuStep   Level -- 1.4      Minutes -- 15      METs -- 1.4        Track   Laps -- 12      Minutes -- 15      METs -- 1.65               Exercise Comments:   Exercise Comments     Row Name 03/08/23 1039           Exercise Comments First full day of exercise!  Patient was oriented to gym and equipment including functions, settings, policies, and procedures.  Patient's individual exercise prescription and treatment plan were reviewed.  All starting workloads were established based on the results of the 6 minute walk test done at initial orientation visit.  The plan for exercise progression was also introduced and progression will be customized based on patient's performance and goals.                Exercise Goals and Review:   Exercise Goals     Row Name 03/03/23 1551             Exercise Goals  Increase Physical Activity Yes       Intervention Develop an individualized exercise prescription for aerobic and resistive training based on initial evaluation findings, risk stratification, comorbidities and participant's personal goals.;Provide advice, education, support and counseling about physical activity/exercise needs.       Expected Outcomes Short Term: Attend rehab on a regular basis to increase amount of physical activity.;Long Term: Add in home exercise to make exercise part of routine and to increase amount of physical activity.;Long Term: Exercising regularly at least 3-5 days a week.       Increase Strength and Stamina Yes       Intervention Provide advice, education, support and counseling about physical activity/exercise needs.;Develop an individualized exercise prescription for aerobic and resistive training based on initial evaluation findings, risk stratification, comorbidities and participant's personal goals.       Expected Outcomes Short Term: Perform resistance training exercises routinely during rehab  and add in resistance training at home;Short Term: Increase workloads from initial exercise prescription for resistance, speed, and METs.;Long Term: Improve cardiorespiratory fitness, muscular endurance and strength as measured by increased METs and functional capacity ( )       Able to understand and use rate of perceived exertion (RPE) scale Yes       Intervention Provide education and explanation on how to use RPE scale       Expected Outcomes Short Term: Able to use RPE daily in rehab to express subjective intensity level;Long Term:  Able to use RPE to guide intensity level when exercising independently       Able to understand and use Dyspnea scale Yes       Intervention Provide education and explanation on how to use Dyspnea scale       Expected Outcomes Long Term: Able to use Dyspnea scale to guide intensity level when exercising independently;Short Term: Able to use Dyspnea scale daily in rehab to express subjective sense of shortness of breath during exertion       Knowledge and understanding of Target Heart Rate Range (THRR) Yes       Intervention Provide education and explanation of THRR including how the numbers were predicted and where they are located for reference       Expected Outcomes Short Term: Able to state/look up THRR;Long Term: Able to use THRR to govern intensity when exercising independently;Short Term: Able to use daily as guideline for intensity in rehab       Able to check pulse independently Yes       Intervention Provide education and demonstration on how to check pulse in carotid and radial arteries.;Review the importance of being able to check your own pulse for safety during independent exercise       Expected Outcomes Short Term: Able to explain why pulse checking is important during independent exercise       Understanding of Exercise Prescription Yes       Intervention Provide education, explanation, and written materials on patient's individual exercise  prescription       Expected Outcomes Short Term: Able to explain program exercise prescription;Long Term: Able to explain home exercise prescription to exercise independently                Exercise Goals Re-Evaluation :  Exercise Goals Re-Evaluation     Row Name 03/08/23 1039 03/22/23 1435 04/07/23 1504 04/20/23 0942       Exercise Goal Re-Evaluation   Exercise Goals Review Able to understand and use rate of perceived exertion (  RPE) scale;Knowledge and understanding of Target Heart Rate Range (THRR);Able to understand and use Dyspnea scale;Understanding of Exercise Prescription Increase Physical Activity;Understanding of Exercise Prescription;Increase Strength and Stamina Increase Physical Activity;Understanding of Exercise Prescription;Increase Strength and Stamina Increase Physical Activity;Understanding of Exercise Prescription;Increase Strength and Stamina    Comments Reviewed RPE  and dyspnea scale, THR and program prescription with pt today.  Pt voiced understanding and was given a copy of goals to take home. Olegario Messier is off to a start in the program, she has completed one exercise session. We will continue to monitor her progress in the program. Olegario Messier has not attended rehab since 03/08/2023. We will reach out to see when she plans to return to rehab. We will continue to monitor her progress once she returns to the program. Olegario Messier has not attended rehab since 03/08/2023. She has been placed on medical hold due to her severe arthritis and is waiting to see her rheumatologist. We will continue to monitor her progress once she returns to the program.    Expected Outcomes Short: Use RPE daily to regulate intensity. Long: Follow program prescription in THR. Short: Continue to follow current exercise prescription. Long: Continue exercise to improve strength and stamina. Short: Return to rehab when appropriate. Long: Continue exercise to improve strength and stamina. Short: Return to rehab when  appropriate. Long: Continue exercise to improve strength and stamina.             Discharge Exercise Prescription (Final Exercise Prescription Changes):  Exercise Prescription Changes - 03/22/23 1400       Response to Exercise   Blood Pressure (Admit) 128/66    Blood Pressure (Exercise) 128/66    Blood Pressure (Exit) 120/68    Heart Rate (Admit) 92 bpm    Heart Rate (Exercise) 108 bpm    Heart Rate (Exit) 100 bpm    Rating of Perceived Exertion (Exercise) 15    Duration Progress to 30 minutes of  aerobic without signs/symptoms of physical distress    Intensity THRR unchanged      Progression   Progression Continue to progress workloads to maintain intensity without signs/symptoms of physical distress.    Average METs 1.48      Resistance Training   Training Prescription Yes    Weight 2 lb    Reps 10-15      Recumbant Bike   Level 1    Watts 15    Minutes 15    METs 1.4      NuStep   Level 1.4    Minutes 15    METs 1.4      Track   Laps 12    Minutes 15    METs 1.65             Nutrition:  Target Goals: Understanding of nutrition guidelines, daily intake of sodium 1500mg , cholesterol 200mg , calories 30% from fat and 7% or less from saturated fats, daily to have 5 or more servings of fruits and vegetables.  Education: All About Nutrition: -Group instruction provided by verbal, written material, interactive activities, discussions, models, and posters to present general guidelines for heart healthy nutrition including fat, fiber, MyPlate, the role of sodium in heart healthy nutrition, utilization of the nutrition label, and utilization of this knowledge for meal planning. Follow up email sent as well. Written material given at graduation.   Biometrics:  Pre Biometrics - 03/03/23 1552       Pre Biometrics   Height 5' 2.5" (1.588 m)  Weight 152 lb 6.4 oz (69.1 kg)    Waist Circumference 33 inches    Hip Circumference 40 inches    Waist to Hip  Ratio 0.83 %    BMI (Calculated) 27.41    Single Leg Stand 9.6 seconds              Nutrition Therapy Plan and Nutrition Goals:   Nutrition Assessments:  MEDIFICTS Score Key: >=70 Need to make dietary changes  40-70 Heart Healthy Diet <= 40 Therapeutic Level Cholesterol Diet  Flowsheet Row Cardiac Rehab from 03/08/2023 in [facility] Cardiac and Pulmonary Rehab  Picture Your Plate Total Score on Admission 75      Picture Your Plate Scores: <16 Unhealthy dietary pattern with much room for improvement. 41-50 Dietary pattern unlikely to meet recommendations for good health and room for improvement. 51-60 More healthful dietary pattern, with some room for improvement.  >60 Healthy dietary pattern, although there may be some specific behaviors that could be improved.    Nutrition Goals Re-Evaluation:   Nutrition Goals Discharge (Final Nutrition Goals Re-Evaluation):   Psychosocial: Target Goals: Acknowledge presence or absence of significant depression and/or stress, maximize coping skills, provide positive support system. Participant is able to verbalize types and ability to use techniques and skills needed for reducing stress and depression.   Education: Stress, Anxiety, and Depression - Group verbal and visual presentation to define topics covered.  Reviews how body is impacted by stress, anxiety, and depression.  Also discusses healthy ways to reduce stress and to treat/manage anxiety and depression.  Written material given at graduation.   Education: Sleep Hygiene -Provides group verbal and written instruction about how sleep can affect your health.  Define sleep hygiene, discuss sleep cycles and impact of sleep habits. Review good sleep hygiene tips.    Initial Review & Psychosocial Screening:  Initial Psych Review & Screening - 02/21/23 1112       Initial Review   Current issues with Current Sleep Concerns    Comments Emersen has had a bit of trouble sleeping. She feels  half awake at times and is not getting adequate sleep. She can look to her three children and her husband for support.      Family Dynamics   Good Support System? Yes      Barriers   Psychosocial barriers to participate in program There are no identifiable barriers or psychosocial needs.;The patient should benefit from training in stress management and relaxation.      Screening Interventions   Interventions Encouraged to exercise;To provide support and resources with identified psychosocial needs;Provide feedback about the scores to participant    Expected Outcomes Short Term goal: Utilizing psychosocial counselor, staff and physician to assist with identification of specific Stressors or current issues interfering with healing process. Setting desired goal for each stressor or current issue identified.;Long Term Goal: Stressors or current issues are controlled or eliminated.;Short Term goal: Identification and review with participant of any Quality of Life or Depression concerns found by scoring the questionnaire.;Long Term goal: The participant improves quality of Life and PHQ9 Scores as seen by post scores and/or verbalization of changes             Quality of Life Scores:   Quality of Life - 03/08/23 1130       Quality of Life   Select Quality of Life      Quality of Life Scores   Health/Function Pre 19.21 %    Socioeconomic Pre 21.93 %  Psych/Spiritual Pre 23.57 %    Family Pre 239 %    GLOBAL Pre 21.42 %            Scores of 19 and below usually indicate a poorer quality of life in these areas.  A difference of  2-3 points is a clinically meaningful difference.  A difference of 2-3 points in the total score of the Quality of Life Index has been associated with significant improvement in overall quality of life, self-image, physical symptoms, and general health in studies assessing change in quality of life.  PHQ-9: Review Flowsheet       03/03/2023  Depression  screen PHQ 2/9  Decreased Interest 3  Down, Depressed, Hopeless 1  PHQ - 2 Score 4  Altered sleeping 0  Tired, decreased energy 1  Change in appetite 0  Feeling bad or failure about yourself  0  Trouble concentrating 1  Moving slowly or fidgety/restless 0  Suicidal thoughts 0  PHQ-9 Score 6  Difficult doing work/chores Somewhat difficult    Details           Interpretation of Total Score  Total Score Depression Severity:  1-4 = Minimal depression, 5-9 = Mild depression, 10-14 = Moderate depression, 15-19 = Moderately severe depression, 20-27 = Severe depression   Psychosocial Evaluation and Intervention:  Psychosocial Evaluation - 02/21/23 1113       Psychosocial Evaluation & Interventions   Interventions Encouraged to exercise with the program and follow exercise prescription;Relaxation education;Stress management education    Comments Jocely has had a bit of trouble sleeping. She feels half awake at times and is not getting adequate sleep. She can look to her three children and her husband for support.    Expected Outcomes Short: Start HeartTrack to help with mood. Long: Maintain a healthy mental state    Continue Psychosocial Services  Follow up required by staff             Psychosocial Re-Evaluation:   Psychosocial Discharge (Final Psychosocial Re-Evaluation):   Vocational Rehabilitation: Provide vocational rehab assistance to qualifying candidates.   Vocational Rehab Evaluation & Intervention:   Education: Education Goals: Education classes will be provided on a variety of topics geared toward better understanding of heart health and risk factor modification. Participant will state understanding/return demonstration of topics presented as noted by education test scores.  Learning Barriers/Preferences:  Learning Barriers/Preferences - 02/21/23 1110       Learning Barriers/Preferences   Learning Barriers None    Learning Preferences None              General Cardiac Education Topics:  AED/CPR: - Group verbal and written instruction with the use of models to demonstrate the basic use of the AED with the basic ABC's of resuscitation.   Anatomy and Cardiac Procedures: - Group verbal and visual presentation and models provide information about basic cardiac anatomy and function. Reviews the testing methods done to diagnose heart disease and the outcomes of the test results. Describes the treatment choices: Medical Management, Angioplasty, or Coronary Bypass Surgery for treating various heart conditions including Myocardial Infarction, Angina, Valve Disease, and Cardiac Arrhythmias.  Written material given at graduation.   Medication Safety: - Group verbal and visual instruction to review commonly prescribed medications for heart and lung disease. Reviews the medication, class of the drug, and side effects. Includes the steps to properly store meds and maintain the prescription regimen.  Written material given at graduation.   Intimacy: - Group verbal instruction  through game format to discuss how heart and lung disease can affect sexual intimacy. Written material given at graduation..   Know Your Numbers and Heart Failure: - Group verbal and visual instruction to discuss disease risk factors for cardiac and pulmonary disease and treatment options.  Reviews associated critical values for Overweight/Obesity, Hypertension, Cholesterol, and Diabetes.  Discusses basics of heart failure: signs/symptoms and treatments.  Introduces Heart Failure Zone chart for action plan for heart failure.  Written material given at graduation.   Infection Prevention: - Provides verbal and written material to individual with discussion of infection control including proper hand washing and proper equipment cleaning during exercise session. Flowsheet Row Cardiac Rehab from 02/21/2023 in [facility] Cardiac and Pulmonary Rehab  Date 02/21/23  Educator [facility]  Instruction  Review Code 1- Verbalizes Understanding       Falls Prevention: - Provides verbal and written material to individual with discussion of falls prevention and safety. Flowsheet Row Cardiac Rehab from 02/21/2023 in [facility] Cardiac and Pulmonary Rehab  Date 02/21/23  Educator [facility]  Instruction Review Code 1- Verbalizes Understanding       Other: -Provides group and verbal instruction on various topics (see comments)   Knowledge Questionnaire Score:  Knowledge Questionnaire Score - 03/08/23 1128       Knowledge Questionnaire Score   Pre Score 15/26             Core Components/Risk Factors/Patient Goals at Admission:  Personal Goals and Risk Factors at Admission - 02/21/23 1111       Core Components/Risk Factors/Patient Goals on Admission    Weight Management Yes;Weight Maintenance    Intervention Weight Management: Develop a combined nutrition and exercise program designed to reach desired caloric intake, while maintaining appropriate intake of nutrient and fiber, sodium and fats, and appropriate energy expenditure required for the weight goal.;Weight Management: Provide education and appropriate resources to help participant work on and attain dietary goals.;Weight Management/Obesity: Establish reasonable short term and long term weight goals.    Expected Outcomes Short Term: Continue to assess and modify interventions until short term weight is achieved;Long Term: Adherence to nutrition and physical activity/exercise program aimed toward attainment of established weight goal;Weight Maintenance: Understanding of the daily nutrition guidelines, which includes 25-35% calories from fat, 7% or less cal from saturated fats, less than 200mg  cholesterol, less than 1.5gm of sodium, & 5 or more servings of fruits and vegetables daily;Understanding recommendations for meals to include 15-35% energy as protein, 25-35% energy from fat, 35-60% energy from carbohydrates, less than 200mg  of dietary  cholesterol, 20-35 gm of total fiber daily;Understanding of distribution of calorie intake throughout the day with the consumption of 4-5 meals/snacks    Heart Failure Yes    Intervention Provide a combined exercise and nutrition program that is supplemented with education, support and counseling about heart failure. Directed toward relieving symptoms such as shortness of breath, decreased exercise tolerance, and extremity edema.    Expected Outcomes Improve functional capacity of life;Short term: Attendance in program 2-3 days a week with increased exercise capacity. Reported lower sodium intake. Reported increased fruit and vegetable intake. Reports medication compliance.;Short term: Daily weights obtained and reported for increase. Utilizing diuretic protocols set by physician.;Long term: Adoption of self-care skills and reduction of barriers for early signs and symptoms recognition and intervention leading to self-care maintenance.    Hypertension Yes    Intervention Provide education on lifestyle modifcations including regular physical activity/exercise, weight management, moderate sodium restriction and increased consumption of fresh fruit,  vegetables, and low fat dairy, alcohol moderation, and smoking cessation.;Monitor prescription use compliance.    Expected Outcomes Short Term: Continued assessment and intervention until BP is < 140/20mm HG in hypertensive participants. < 130/61mm HG in hypertensive participants with diabetes, heart failure or chronic kidney disease.;Long Term: Maintenance of blood pressure at goal levels.    Lipids Yes    Intervention Provide education and support for participant on nutrition & aerobic/resistive exercise along with prescribed medications to achieve LDL 70mg , HDL >40mg .    Expected Outcomes Short Term: Participant states understanding of desired cholesterol values and is compliant with medications prescribed. Participant is following exercise prescription and  nutrition guidelines.;Long Term: Cholesterol controlled with medications as prescribed, with individualized exercise RX and with personalized nutrition plan. Value goals: LDL < 70mg , HDL > 40 mg.             Education:Diabetes - Individual verbal and written instruction to review signs/symptoms of diabetes, desired ranges of glucose level fasting, after meals and with exercise. Acknowledge that pre and post exercise glucose checks will be done for 3 sessions at entry of program.   Core Components/Risk Factors/Patient Goals Review:    Core Components/Risk Factors/Patient Goals at Discharge (Final Review):    ITP Comments:  ITP Comments     Row Name 02/21/23 1110 03/03/23 1547 03/08/23 1039 03/30/23 1255 04/11/23 1521   ITP Comments Virtual Visit completed. Patient informed on EP and RD appointment and 6 Minute walk test. Patient also informed of patient health questionnaires on My Chart. Patient Verbalizes understanding. Visit diagnosis can be found in [facility]/27/[facility]. Completed and gym orientation. Initial ITP created and sent for review to Dr. Bethann Punches, Medical Director. First full day of exercise!  Patient was oriented to gym and equipment including functions, settings, policies, and procedures.  Patient's individual exercise prescription and treatment plan were reviewed.  All starting workloads were established based on the results of the 6 minute walk test done at initial orientation visit.  The plan for exercise progression was also introduced and progression will be customized based on patient's performance and goals. 30 Day review completed. Medical Director ITP review done, changes made as directed, and signed approval by Medical Director.    new to program Called to check on Olegario Messier, she has not been to rehab since 9/10. Olegario Messier said she had called and spoke with staff to make aware she has been waiting to get scheduled with rheumatologist for her arthritis. She has had issues with  getting this appointment scheduled. She tried to work out on a bike at home and was in sever pain the next day. She will need to be placed on medical hold until she sees Dr. Allena Katz with [facility]. She is hoping to hear from them within the week. I have cancelled her appointments next week. She will let us know when she is scheduled. Otherwise, I told her I will follow up with her in a week if I have not heard back. Olegario Messier appreciative and voiced understanding.    Row Name 04/21/23 1315 04/27/23 1043         ITP Comments Spoke with Olegario Messier. She has a rheumatology appointment scheduled 11/11. She does want to continue rehab if she is able. Will follow up with her after this appointment to discuss plan moving forward and determine if she will be able to resume rehab. Will keep her on medical hold until this appointment. 30 Day review completed. Medical Director ITP review done, changes  made as directed, and signed approval by Medical Director. on hold medical               Comments:

## 2023-05-11 ENCOUNTER — Encounter: Payer: Self-pay | Admitting: *Deleted

## 2023-05-11 NOTE — Telephone Encounter (Signed)
Spoke with Olegario Messier. She had her rheumatology appt today. Had Xray and lab work done. She will call us when she has results and knows the plan moving forward. She has been walking almost daily but her knees are still swelling. Will wait to hear back from pt regarding when she can resume rehab.

## 2023-05-12 ENCOUNTER — Encounter: Payer: Medicare HMO | Admitting: *Deleted

## 2023-05-18 ENCOUNTER — Encounter: Payer: Self-pay | Admitting: *Deleted

## 2023-05-18 DIAGNOSIS — I214 Non-ST elevation (NSTEMI) myocardial infarction: Secondary | ICD-10-CM

## 2023-05-18 DIAGNOSIS — Z955 Presence of coronary angioplasty implant and graft: Secondary | ICD-10-CM

## 2023-05-18 NOTE — Progress Notes (Signed)
Cardiac Individual Treatment Plan  Patient Details  Name: Katie Beltran MRN: [id number] Date of Birth: May 22, 1941 Referring Provider:   Flowsheet Row Cardiac Rehab from 03/03/2023 in [facility] Cardiac and Pulmonary Rehab  Referring Provider Dr. Marcina Millard MD       Initial Encounter Date:  Flowsheet Row Cardiac Rehab from 03/03/2023 in [facility] Cardiac and Pulmonary Rehab  Date 03/03/23       Visit Diagnosis: Status post coronary artery stent placement  NSTEMI (non-ST elevated myocardial infarction) [facility])  Patient's Home Medications on Admission:  Current Outpatient Medications:    acetaminophen (TYLENOL) 325 MG tablet, Take 2 tablets (650 mg total) by mouth every 4 (four) hours as needed for headache or mild pain., Disp: 20 tablet, Rfl: 0   apixaban (ELIQUIS) 5 MG TABS tablet, Take 1 tablet (5 mg total) by mouth 2 (two) times daily., Disp: 60 tablet, Rfl: 11   clopidogrel (PLAVIX) 75 MG tablet, Take 1 tablet (75 mg total) by mouth daily with breakfast., Disp: 30 tablet, Rfl: 11   famotidine (PEPCID) 20 MG tablet, Take 1 tablet (20 mg total) by mouth 2 (two) times daily., Disp: 30 tablet, Rfl: 0   furosemide (LASIX) 20 MG tablet, Take 20 mg by mouth daily., Disp: , Rfl:    losartan (COZAAR) 25 MG tablet, Take 1 tablet by mouth daily., Disp: , Rfl:    metoprolol succinate (TOPROL-XL) 25 MG 24 hr tablet, Take 1 tablet (25 mg total) by mouth daily., Disp: 30 tablet, Rfl: 0   metoprolol succinate (TOPROL-XL) 25 MG 24 hr tablet, Take 1 tablet by mouth daily. (Patient not taking: Reported on 02/21/2023), Disp: , Rfl:    Multiple Vitamin (MULTI-VITAMIN) tablet, Take 1 tablet by mouth daily., Disp: 30 tablet, Rfl: 0   REPATHA SURECLICK 140 MG/ML SOAJ, Inject into the skin., Disp: , Rfl:    rosuvastatin (CRESTOR) 5 MG tablet, Take 1 tablet (5 mg total) by mouth daily. (Patient not taking: Reported on 02/21/2023), Disp: 30 tablet, Rfl: 0   zinc gluconate 50 MG tablet, Take 1 tablet (50 mg total)  by mouth daily., Disp: 30 tablet, Rfl: 0  Past Medical History: Past Medical History:  Diagnosis Date   Acid reflux    Environmental allergies    Hypercholesteremia    Hypertension    Recurrent UTI     Tobacco Use: Social History   Tobacco Use  Smoking Status Never  Smokeless Tobacco Never    Labs: Review Flowsheet        No data to display           Exercise Target Goals: Exercise Program Goal: Individual exercise prescription set using results from initial 6 min walk test and THRR while considering  patient's activity barriers and safety.   Exercise Prescription Goal: Initial exercise prescription builds to 30-45 minutes a day of aerobic activity, 2-3 days per week.  Home exercise guidelines will be given to patient during program as part of exercise prescription that the participant will acknowledge.   Education: Aerobic Exercise: - Group verbal and visual presentation on the components of exercise prescription. Introduces F.I.T.T principle from ACSM for exercise prescriptions.  Reviews F.I.T.T. principles of aerobic exercise including progression. Written material given at graduation.   Education: Resistance Exercise: - Group verbal and visual presentation on the components of exercise prescription. Introduces F.I.T.T principle from ACSM for exercise prescriptions  Reviews F.I.T.T. principles of resistance exercise including progression. Written material given at graduation.    Education: Exercise & Equipment  Safety: - Individual verbal instruction and demonstration of equipment use and safety with use of the equipment. Flowsheet Row Cardiac Rehab from 02/21/2023 in [facility] Cardiac and Pulmonary Rehab  Date 02/21/23  Educator [facility]  Instruction Review Code 1- Verbalizes Understanding       Education: Exercise Physiology & General Exercise Guidelines: - Group verbal and written instruction with models to review the exercise physiology of the cardiovascular system  and associated critical values. Provides general exercise guidelines with specific guidelines to those with heart or lung disease.    Education: Flexibility, Balance, Mind/Body Relaxation: - Group verbal and visual presentation with interactive activity on the components of exercise prescription. Introduces F.I.T.T principle from ACSM for exercise prescriptions. Reviews F.I.T.T. principles of flexibility and balance exercise training including progression. Also discusses the mind body connection.  Reviews various relaxation techniques to help reduce and manage stress (i.e. Deep breathing, progressive muscle relaxation, and visualization). Balance handout provided to take home. Written material given at graduation.   Activity Barriers & Risk Stratification:  Activity Barriers & Cardiac Risk Stratification - 03/03/23 1551       Activity Barriers & Cardiac Risk Stratification   Activity Barriers Back Problems;Joint Problems;Other (comment)    Comments Knee swelling/pain    Cardiac Risk Stratification Moderate             6 Minute Walk:  6 Minute Walk     Row Name 03/03/23 1549         6 Minute Walk   Phase Initial     Distance 735 feet     Walk Time 6 minutes     # of Rest Breaks 0     MPH 1.39     METS 1.29     RPE 13     Perceived Dyspnea  0     VO2 Peak 4.53     Symptoms No  Knees swollen upon arrival     Resting HR 100 bpm     Resting BP 112/66     Resting Oxygen Saturation  99 %     Exercise Oxygen Saturation  during 6 min walk 99 %     Max Ex. HR 113 bpm     Max Ex. BP 130/72     2 Minute Post BP 108/64              Oxygen Initial Assessment:   Oxygen Re-Evaluation:   Oxygen Discharge (Final Oxygen Re-Evaluation):   Initial Exercise Prescription:  Initial Exercise Prescription - 03/03/23 1500       Date of Initial Exercise RX and Referring Provider   Date 03/03/23    Referring Provider Dr. Marcina Millard MD      Oxygen   Maintain Oxygen  Saturation 88% or higher      Recumbant Bike   Level 1    RPM 50    Watts 15    Minutes 15    METs 1.29      NuStep   Level 1    SPM 80    Minutes 15    METs 1.29      Biostep-RELP   Level 1    SPM 50    Minutes 15    METs 1.29      Track   Laps 12    Minutes 15    METs 1.65      Prescription Details   Frequency (times per week) 2    Duration Progress to 30 minutes of continuous  aerobic without signs/symptoms of physical distress      Intensity   THRR 40-80% of Max Heartrate 115-130    Ratings of Perceived Exertion 11-13    Perceived Dyspnea 0-4      Progression   Progression Continue to progress workloads to maintain intensity without signs/symptoms of physical distress.      Resistance Training   Training Prescription Yes    Weight 2 lb    Reps 10-15             Perform Capillary Blood Glucose checks as needed.  Exercise Prescription Changes:   Exercise Prescription Changes     Row Name 03/03/23 1500 03/22/23 1400           Response to Exercise   Blood Pressure (Admit) 112/66 128/66      Blood Pressure (Exercise) 130/72 128/66      Blood Pressure (Exit) 108/64 120/68      Heart Rate (Admit) 100 bpm 92 bpm      Heart Rate (Exercise) 113 bpm 108 bpm      Heart Rate (Exit) 104 bpm 100 bpm      Oxygen Saturation (Admit) 99 % --      Oxygen Saturation (Exercise) 99 % --      Rating of Perceived Exertion (Exercise) 13 15      Perceived Dyspnea (Exercise) 0 --      Symptoms none --      Comments Results --      Duration -- Progress to 30 minutes of  aerobic without signs/symptoms of physical distress      Intensity -- THRR unchanged        Progression   Progression -- Continue to progress workloads to maintain intensity without signs/symptoms of physical distress.      Average METs -- 1.48        Resistance Training   Training Prescription -- Yes      Weight -- 2 lb      Reps -- 10-15        Recumbant Bike   Level -- 1      Watts  -- 15      Minutes -- 15      METs -- 1.4        NuStep   Level -- 1.4      Minutes -- 15      METs -- 1.4        Track   Laps -- 12      Minutes -- 15      METs -- 1.65               Exercise Comments:   Exercise Comments     Row Name 03/08/23 1039           Exercise Comments First full day of exercise!  Patient was oriented to gym and equipment including functions, settings, policies, and procedures.  Patient's individual exercise prescription and treatment plan were reviewed.  All starting workloads were established based on the results of the 6 minute walk test done at initial orientation visit.  The plan for exercise progression was also introduced and progression will be customized based on patient's performance and goals.                Exercise Goals and Review:   Exercise Goals     Row Name 03/03/23 1551             Exercise Goals  Increase Physical Activity Yes       Intervention Develop an individualized exercise prescription for aerobic and resistive training based on initial evaluation findings, risk stratification, comorbidities and participant's personal goals.;Provide advice, education, support and counseling about physical activity/exercise needs.       Expected Outcomes Short Term: Attend rehab on a regular basis to increase amount of physical activity.;Long Term: Add in home exercise to make exercise part of routine and to increase amount of physical activity.;Long Term: Exercising regularly at least 3-5 days a week.       Increase Strength and Stamina Yes       Intervention Provide advice, education, support and counseling about physical activity/exercise needs.;Develop an individualized exercise prescription for aerobic and resistive training based on initial evaluation findings, risk stratification, comorbidities and participant's personal goals.       Expected Outcomes Short Term: Perform resistance training exercises routinely during rehab  and add in resistance training at home;Short Term: Increase workloads from initial exercise prescription for resistance, speed, and METs.;Long Term: Improve cardiorespiratory fitness, muscular endurance and strength as measured by increased METs and functional capacity ( )       Able to understand and use rate of perceived exertion (RPE) scale Yes       Intervention Provide education and explanation on how to use RPE scale       Expected Outcomes Short Term: Able to use RPE daily in rehab to express subjective intensity level;Long Term:  Able to use RPE to guide intensity level when exercising independently       Able to understand and use Dyspnea scale Yes       Intervention Provide education and explanation on how to use Dyspnea scale       Expected Outcomes Long Term: Able to use Dyspnea scale to guide intensity level when exercising independently;Short Term: Able to use Dyspnea scale daily in rehab to express subjective sense of shortness of breath during exertion       Knowledge and understanding of Target Heart Rate Range (THRR) Yes       Intervention Provide education and explanation of THRR including how the numbers were predicted and where they are located for reference       Expected Outcomes Short Term: Able to state/look up THRR;Long Term: Able to use THRR to govern intensity when exercising independently;Short Term: Able to use daily as guideline for intensity in rehab       Able to check pulse independently Yes       Intervention Provide education and demonstration on how to check pulse in carotid and radial arteries.;Review the importance of being able to check your own pulse for safety during independent exercise       Expected Outcomes Short Term: Able to explain why pulse checking is important during independent exercise       Understanding of Exercise Prescription Yes       Intervention Provide education, explanation, and written materials on patient's individual exercise  prescription       Expected Outcomes Short Term: Able to explain program exercise prescription;Long Term: Able to explain home exercise prescription to exercise independently                Exercise Goals Re-Evaluation :  Exercise Goals Re-Evaluation     Row Name 03/08/23 1039 03/22/23 1435 04/07/23 1504 04/20/23 0942 05/06/23 0856     Exercise Goal Re-Evaluation   Exercise Goals Review Able to understand and use rate of perceived exertion (  RPE) scale;Knowledge and understanding of Target Heart Rate Range (THRR);Able to understand and use Dyspnea scale;Understanding of Exercise Prescription Increase Physical Activity;Understanding of Exercise Prescription;Increase Strength and Stamina Increase Physical Activity;Understanding of Exercise Prescription;Increase Strength and Stamina Increase Physical Activity;Understanding of Exercise Prescription;Increase Strength and Stamina Increase Physical Activity;Understanding of Exercise Prescription;Increase Strength and Stamina   Comments Reviewed RPE  and dyspnea scale, THR and program prescription with pt today.  Pt voiced understanding and was given a copy of goals to take home. Katie Beltran is off to a start in the program, she has completed one exercise session. We will continue to monitor her progress in the program. Katie Beltran has not attended rehab since 03/08/2023. We will reach out to see when she plans to return to rehab. We will continue to monitor her progress once she returns to the program. Katie Beltran has not attended rehab since 03/08/2023. She has been placed on medical hold due to her severe arthritis and is waiting to see her rheumatologist. We will continue to monitor her progress once she returns to the program. Katie Beltran continues to be out on medical hold due to her severe arthritis and is waiting to see her rheumatologist on 05/09/2023. She called and expressed that she would like to continue in the program but will make a final decision after her rheumatology  appt. We will continue to monitor her progress once she returns to the program.   Expected Outcomes Short: Use RPE daily to regulate intensity. Long: Follow program prescription in THR. Short: Continue to follow current exercise prescription. Long: Continue exercise to improve strength and stamina. Short: Return to rehab when appropriate. Long: Continue exercise to improve strength and stamina. Short: Return to rehab when appropriate. Long: Continue exercise to improve strength and stamina. Short: Return to rehab when appropriate. Long: Continue exercise to improve strength and stamina.            Discharge Exercise Prescription (Final Exercise Prescription Changes):  Exercise Prescription Changes - 03/22/23 1400       Response to Exercise   Blood Pressure (Admit) 128/66    Blood Pressure (Exercise) 128/66    Blood Pressure (Exit) 120/68    Heart Rate (Admit) 92 bpm    Heart Rate (Exercise) 108 bpm    Heart Rate (Exit) 100 bpm    Rating of Perceived Exertion (Exercise) 15    Duration Progress to 30 minutes of  aerobic without signs/symptoms of physical distress    Intensity THRR unchanged      Progression   Progression Continue to progress workloads to maintain intensity without signs/symptoms of physical distress.    Average METs 1.48      Resistance Training   Training Prescription Yes    Weight 2 lb    Reps 10-15      Recumbant Bike   Level 1    Watts 15    Minutes 15    METs 1.4      NuStep   Level 1.4    Minutes 15    METs 1.4      Track   Laps 12    Minutes 15    METs 1.65             Nutrition:  Target Goals: Understanding of nutrition guidelines, daily intake of sodium 1500mg , cholesterol 200mg , calories 30% from fat and 7% or less from saturated fats, daily to have 5 or more servings of fruits and vegetables.  Education: All About Nutrition: -Group instruction provided by verbal, written  material, interactive activities, discussions, models, and  posters to present general guidelines for heart healthy nutrition including fat, fiber, MyPlate, the role of sodium in heart healthy nutrition, utilization of the nutrition label, and utilization of this knowledge for meal planning. Follow up email sent as well. Written material given at graduation.   Biometrics:  Pre Biometrics - 03/03/23 1552       Pre Biometrics   Height 5' 2.5" (1.588 m)    Weight 152 lb 6.4 oz (69.1 kg)    Waist Circumference 33 inches    Hip Circumference 40 inches    Waist to Hip Ratio 0.83 %    BMI (Calculated) 27.41    Single Leg Stand 9.6 seconds              Nutrition Therapy Plan and Nutrition Goals:   Nutrition Assessments:  MEDIFICTS Score Key: >=70 Need to make dietary changes  40-70 Heart Healthy Diet <= 40 Therapeutic Level Cholesterol Diet  Flowsheet Row Cardiac Rehab from 03/08/2023 in [facility] Cardiac and Pulmonary Rehab  Picture Your Plate Total Score on Admission 75      Picture Your Plate Scores: <40 Unhealthy dietary pattern with much room for improvement. 41-50 Dietary pattern unlikely to meet recommendations for good health and room for improvement. 51-60 More healthful dietary pattern, with some room for improvement.  >60 Healthy dietary pattern, although there may be some specific behaviors that could be improved.    Nutrition Goals Re-Evaluation:   Nutrition Goals Discharge (Final Nutrition Goals Re-Evaluation):   Psychosocial: Target Goals: Acknowledge presence or absence of significant depression and/or stress, maximize coping skills, provide positive support system. Participant is able to verbalize types and ability to use techniques and skills needed for reducing stress and depression.   Education: Stress, Anxiety, and Depression - Group verbal and visual presentation to define topics covered.  Reviews how body is impacted by stress, anxiety, and depression.  Also discusses healthy ways to reduce stress and to  treat/manage anxiety and depression.  Written material given at graduation.   Education: Sleep Hygiene -Provides group verbal and written instruction about how sleep can affect your health.  Define sleep hygiene, discuss sleep cycles and impact of sleep habits. Review good sleep hygiene tips.    Initial Review & Psychosocial Screening:  Initial Psych Review & Screening - 02/21/23 1112       Initial Review   Current issues with Current Sleep Concerns    Comments Katie Beltran has had a bit of trouble sleeping. She feels half awake at times and is not getting adequate sleep. She can look to her three children and her husband for support.      Family Dynamics   Good Support System? Yes      Barriers   Psychosocial barriers to participate in program There are no identifiable barriers or psychosocial needs.;The patient should benefit from training in stress management and relaxation.      Screening Interventions   Interventions Encouraged to exercise;To provide support and resources with identified psychosocial needs;Provide feedback about the scores to participant    Expected Outcomes Short Term goal: Utilizing psychosocial counselor, staff and physician to assist with identification of specific Stressors or current issues interfering with healing process. Setting desired goal for each stressor or current issue identified.;Long Term Goal: Stressors or current issues are controlled or eliminated.;Short Term goal: Identification and review with participant of any Quality of Life or Depression concerns found by scoring the questionnaire.;Long Term goal: The participant improves  quality of Life and PHQ9 Scores as seen by post scores and/or verbalization of changes             Quality of Life Scores:   Quality of Life - 03/08/23 1130       Quality of Life   Select Quality of Life      Quality of Life Scores   Health/Function Pre 19.21 %    Socioeconomic Pre 21.93 %    Psych/Spiritual Pre 23.57  %    Family Pre 239 %    GLOBAL Pre 21.42 %            Scores of 19 and below usually indicate a poorer quality of life in these areas.  A difference of  2-3 points is a clinically meaningful difference.  A difference of 2-3 points in the total score of the Quality of Life Index has been associated with significant improvement in overall quality of life, self-image, physical symptoms, and general health in studies assessing change in quality of life.  PHQ-9: Review Flowsheet       03/03/2023  Depression screen PHQ 2/9  Decreased Interest 3  Down, Depressed, Hopeless 1  PHQ - 2 Score 4  Altered sleeping 0  Tired, decreased energy 1  Change in appetite 0  Feeling bad or failure about yourself  0  Trouble concentrating 1  Moving slowly or fidgety/restless 0  Suicidal thoughts 0  PHQ-9 Score 6  Difficult doing work/chores Somewhat difficult    Details           Interpretation of Total Score  Total Score Depression Severity:  1-4 = Minimal depression, 5-9 = Mild depression, 10-14 = Moderate depression, 15-19 = Moderately severe depression, 20-27 = Severe depression   Psychosocial Evaluation and Intervention:  Psychosocial Evaluation - 02/21/23 1113       Psychosocial Evaluation & Interventions   Interventions Encouraged to exercise with the program and follow exercise prescription;Relaxation education;Stress management education    Comments Secilia has had a bit of trouble sleeping. She feels half awake at times and is not getting adequate sleep. She can look to her three children and her husband for support.    Expected Outcomes Short: Start HeartTrack to help with mood. Long: Maintain a healthy mental state    Continue Psychosocial Services  Follow up required by staff             Psychosocial Re-Evaluation:   Psychosocial Discharge (Final Psychosocial Re-Evaluation):   Vocational Rehabilitation: Provide vocational rehab assistance to qualifying candidates.    Vocational Rehab Evaluation & Intervention:   Education: Education Goals: Education classes will be provided on a variety of topics geared toward better understanding of heart health and risk factor modification. Participant will state understanding/return demonstration of topics presented as noted by education test scores.  Learning Barriers/Preferences:  Learning Barriers/Preferences - 02/21/23 1110       Learning Barriers/Preferences   Learning Barriers None    Learning Preferences None             General Cardiac Education Topics:  AED/CPR: - Group verbal and written instruction with the use of models to demonstrate the basic use of the AED with the basic ABC's of resuscitation.   Anatomy and Cardiac Procedures: - Group verbal and visual presentation and models provide information about basic cardiac anatomy and function. Reviews the testing methods done to diagnose heart disease and the outcomes of the test results. Describes the treatment choices: Medical Management, Angioplasty,  or Coronary Bypass Surgery for treating various heart conditions including Myocardial Infarction, Angina, Valve Disease, and Cardiac Arrhythmias.  Written material given at graduation.   Medication Safety: - Group verbal and visual instruction to review commonly prescribed medications for heart and lung disease. Reviews the medication, class of the drug, and side effects. Includes the steps to properly store meds and maintain the prescription regimen.  Written material given at graduation.   Intimacy: - Group verbal instruction through game format to discuss how heart and lung disease can affect sexual intimacy. Written material given at graduation..   Know Your Numbers and Heart Failure: - Group verbal and visual instruction to discuss disease risk factors for cardiac and pulmonary disease and treatment options.  Reviews associated critical values for Overweight/Obesity, Hypertension,  Cholesterol, and Diabetes.  Discusses basics of heart failure: signs/symptoms and treatments.  Introduces Heart Failure Zone chart for action plan for heart failure.  Written material given at graduation.   Infection Prevention: - Provides verbal and written material to individual with discussion of infection control including proper hand washing and proper equipment cleaning during exercise session. Flowsheet Row Cardiac Rehab from 02/21/2023 in [facility] Cardiac and Pulmonary Rehab  Date 02/21/23  Educator [facility]  Instruction Review Code 1- Verbalizes Understanding       Falls Prevention: - Provides verbal and written material to individual with discussion of falls prevention and safety. Flowsheet Row Cardiac Rehab from 02/21/2023 in [facility] Cardiac and Pulmonary Rehab  Date 02/21/23  Educator [facility]  Instruction Review Code 1- Verbalizes Understanding       Other: -Provides group and verbal instruction on various topics (see comments)   Knowledge Questionnaire Score:  Knowledge Questionnaire Score - 03/08/23 1128       Knowledge Questionnaire Score   Pre Score 15/26             Core Components/Risk Factors/Patient Goals at Admission:  Personal Goals and Risk Factors at Admission - 02/21/23 1111       Core Components/Risk Factors/Patient Goals on Admission    Weight Management Yes;Weight Maintenance    Intervention Weight Management: Develop a combined nutrition and exercise program designed to reach desired caloric intake, while maintaining appropriate intake of nutrient and fiber, sodium and fats, and appropriate energy expenditure required for the weight goal.;Weight Management: Provide education and appropriate resources to help participant work on and attain dietary goals.;Weight Management/Obesity: Establish reasonable short term and long term weight goals.    Expected Outcomes Short Term: Continue to assess and modify interventions until short term weight is achieved;Long Term:  Adherence to nutrition and physical activity/exercise program aimed toward attainment of established weight goal;Weight Maintenance: Understanding of the daily nutrition guidelines, which includes 25-35% calories from fat, 7% or less cal from saturated fats, less than 200mg  cholesterol, less than 1.5gm of sodium, & 5 or more servings of fruits and vegetables daily;Understanding recommendations for meals to include 15-35% energy as protein, 25-35% energy from fat, 35-60% energy from carbohydrates, less than 200mg  of dietary cholesterol, 20-35 gm of total fiber daily;Understanding of distribution of calorie intake throughout the day with the consumption of 4-5 meals/snacks    Heart Failure Yes    Intervention Provide a combined exercise and nutrition program that is supplemented with education, support and counseling about heart failure. Directed toward relieving symptoms such as shortness of breath, decreased exercise tolerance, and extremity edema.    Expected Outcomes Improve functional capacity of life;Short term: Attendance in program 2-3 days a week with increased  exercise capacity. Reported lower sodium intake. Reported increased fruit and vegetable intake. Reports medication compliance.;Short term: Daily weights obtained and reported for increase. Utilizing diuretic protocols set by physician.;Long term: Adoption of self-care skills and reduction of barriers for early signs and symptoms recognition and intervention leading to self-care maintenance.    Hypertension Yes    Intervention Provide education on lifestyle modifcations including regular physical activity/exercise, weight management, moderate sodium restriction and increased consumption of fresh fruit, vegetables, and low fat dairy, alcohol moderation, and smoking cessation.;Monitor prescription use compliance.    Expected Outcomes Short Term: Continued assessment and intervention until BP is < 140/19mm HG in hypertensive participants. < 130/32mm  HG in hypertensive participants with diabetes, heart failure or chronic kidney disease.;Long Term: Maintenance of blood pressure at goal levels.    Lipids Yes    Intervention Provide education and support for participant on nutrition & aerobic/resistive exercise along with prescribed medications to achieve LDL 70mg , HDL >40mg .    Expected Outcomes Short Term: Participant states understanding of desired cholesterol values and is compliant with medications prescribed. Participant is following exercise prescription and nutrition guidelines.;Long Term: Cholesterol controlled with medications as prescribed, with individualized exercise RX and with personalized nutrition plan. Value goals: LDL < 70mg , HDL > 40 mg.             Education:Diabetes - Individual verbal and written instruction to review signs/symptoms of diabetes, desired ranges of glucose level fasting, after meals and with exercise. Acknowledge that pre and post exercise glucose checks will be done for 3 sessions at entry of program.   Core Components/Risk Factors/Patient Goals Review:    Core Components/Risk Factors/Patient Goals at Discharge (Final Review):    ITP Comments:  ITP Comments     Row Name 02/21/23 1110 03/03/23 1547 03/08/23 1039 03/30/23 1255 04/11/23 1521   ITP Comments Virtual Visit completed. Patient informed on EP and RD appointment and 6 Minute walk test. Patient also informed of patient health questionnaires on My Chart. Patient Verbalizes understanding. Visit diagnosis can be found in [facility] 01/27/2023. Completed and gym orientation. Initial ITP created and sent for review to Dr. Bethann Punches, Medical Director. First full day of exercise!  Patient was oriented to gym and equipment including functions, settings, policies, and procedures.  Patient's individual exercise prescription and treatment plan were reviewed.  All starting workloads were established based on the results of the 6 minute walk test done at  initial orientation visit.  The plan for exercise progression was also introduced and progression will be customized based on patient's performance and goals. 30 Day review completed. Medical Director ITP review done, changes made as directed, and signed approval by Medical Director.    new to program Called to check on Katie Beltran, she has not been to rehab since 9/10. Katie Beltran said she had called and spoke with staff to make aware she has been waiting to get scheduled with rheumatologist for her arthritis. She has had issues with getting this appointment scheduled. She tried to work out on a bike at home and was in sever pain the next day. She will need to be placed on medical hold until she sees Dr. Allena Katz with [facility] rheumatology. She is hoping to hear from them within the week. I have cancelled her appointments next week. She will let us know when she is scheduled. Otherwise, I told her I will follow up with her in a week if I have not heard back. Katie Beltran appreciative and voiced understanding.  Row Name 04/21/23 1315 04/27/23 1043 05/11/23 1717 05/18/23 1002     ITP Comments Spoke with Katie Beltran. She has a rheumatology appointment scheduled 11/11. She does want to continue rehab if she is able. Will follow up with her after this appointment to discuss plan moving forward and determine if she will be able to resume rehab. Will keep her on medical hold until this appointment. 30 Day review completed. Medical Director ITP review done, changes made as directed, and signed approval by Medical Director. on hold medical Spoke with Katie Beltran. She had her rheumatology appt today. Had Xray and lab work done. She will call us when she has results and knows the plan moving forward. She has been walking almost daily but her knees are still swelling. Will wait to hear back from pt regarding when she can resume rehab. 30 Day review completed. Medical Director ITP review done, changes made as directed, and signed approval by Medical Director.     remains out with medical issue             Comments:

## 2023-05-18 NOTE — Telephone Encounter (Signed)
Attempted to call pt to follow up. No answer at this time. Lmtcb.

## 2023-05-20 ENCOUNTER — Other Ambulatory Visit: Payer: Self-pay | Admitting: Internal Medicine

## 2023-05-20 DIAGNOSIS — R059 Cough, unspecified: Secondary | ICD-10-CM

## 2023-05-23 ENCOUNTER — Encounter: Payer: Self-pay | Admitting: *Deleted

## 2023-05-23 DIAGNOSIS — I214 Non-ST elevation (NSTEMI) myocardial infarction: Secondary | ICD-10-CM

## 2023-05-23 DIAGNOSIS — Z955 Presence of coronary angioplasty implant and graft: Secondary | ICD-10-CM

## 2023-05-23 NOTE — Progress Notes (Signed)
Discharge Summary:  Katie Beltran  (DOB: [birth year])  Pt discharged early from cardiac rehab due to issues with arthritis and unable to make it to class at this time. She completed 2/36 sessions.    6 Minute Walk     Row Name 03/03/23 1549         6 Minute Walk   Phase Initial     Distance 735 feet     Walk Time 6 minutes     # of Rest Breaks 0     MPH 1.39     METS 1.29     RPE 13     Perceived Dyspnea  0     VO2 Peak 4.53     Symptoms No  Knees swollen upon arrival     Resting HR 100 bpm     Resting BP 112/66     Resting Oxygen Saturation  99 %     Exercise Oxygen Saturation  during 6 min walk 99 %     Max Ex. HR 113 bpm     Max Ex. BP 130/72     2 Minute Post BP 108/64

## 2023-05-23 NOTE — Progress Notes (Signed)
Cardiac Individual Treatment Plan  Patient Details  Name: Katie Beltran MRN: [id number] Date of Birth: March 28, 1941 Referring Provider:   Flowsheet Row Cardiac Rehab from 03/03/2023 in [facility] Cardiac and Pulmonary Rehab  Referring Provider Dr. Marcina Millard MD       Initial Encounter Date:  Flowsheet Row Cardiac Rehab from 03/03/2023 in [facility] Cardiac and Pulmonary Rehab  Date 03/03/23       Visit Diagnosis: NSTEMI (non-ST elevated myocardial infarction) [facility])  Status post coronary artery stent placement  Patient's Home Medications on Admission:  Current Outpatient Medications:    acetaminophen (TYLENOL) 325 MG tablet, Take 2 tablets (650 mg total) by mouth every 4 (four) hours as needed for headache or mild pain., Disp: 20 tablet, Rfl: 0   apixaban (ELIQUIS) 5 MG TABS tablet, Take 1 tablet (5 mg total) by mouth 2 (two) times daily., Disp: 60 tablet, Rfl: 11   clopidogrel (PLAVIX) 75 MG tablet, Take 1 tablet (75 mg total) by mouth daily with breakfast., Disp: 30 tablet, Rfl: 11   famotidine (PEPCID) 20 MG tablet, Take 1 tablet (20 mg total) by mouth 2 (two) times daily., Disp: 30 tablet, Rfl: 0   furosemide (LASIX) 20 MG tablet, Take 20 mg by mouth daily., Disp: , Rfl:    losartan (COZAAR) 25 MG tablet, Take 1 tablet by mouth daily., Disp: , Rfl:    metoprolol succinate (TOPROL-XL) 25 MG 24 hr tablet, Take 1 tablet (25 mg total) by mouth daily., Disp: 30 tablet, Rfl: 0   metoprolol succinate (TOPROL-XL) 25 MG 24 hr tablet, Take 1 tablet by mouth daily. (Patient not taking: Reported on 02/21/2023), Disp: , Rfl:    Multiple Vitamin (MULTI-VITAMIN) tablet, Take 1 tablet by mouth daily., Disp: 30 tablet, Rfl: 0   REPATHA SURECLICK 140 MG/ML SOAJ, Inject into the skin., Disp: , Rfl:    rosuvastatin (CRESTOR) 5 MG tablet, Take 1 tablet (5 mg total) by mouth daily. (Patient not taking: Reported on 02/21/2023), Disp: 30 tablet, Rfl: 0   zinc gluconate 50 MG tablet, Take 1 tablet (50 mg total)  by mouth daily., Disp: 30 tablet, Rfl: 0  Past Medical History: Past Medical History:  Diagnosis Date   Acid reflux    Environmental allergies    Hypercholesteremia    Hypertension    Recurrent UTI     Tobacco Use: Social History   Tobacco Use  Smoking Status Never  Smokeless Tobacco Never    Labs: Review Flowsheet        No data to display           Exercise Target Goals: Exercise Program Goal: Individual exercise prescription set using results from initial 6 min walk test and THRR while considering  patient's activity barriers and safety.   Exercise Prescription Goal: Initial exercise prescription builds to 30-45 minutes a day of aerobic activity, 2-3 days per week.  Home exercise guidelines will be given to patient during program as part of exercise prescription that the participant will acknowledge.   Education: Aerobic Exercise: - Group verbal and visual presentation on the components of exercise prescription. Introduces F.I.T.T principle from ACSM for exercise prescriptions.  Reviews F.I.T.T. principles of aerobic exercise including progression. Written material given at graduation.   Education: Resistance Exercise: - Group verbal and visual presentation on the components of exercise prescription. Introduces F.I.T.T principle from ACSM for exercise prescriptions  Reviews F.I.T.T. principles of resistance exercise including progression. Written material given at graduation.    Education: Exercise & Equipment  Safety: - Individual verbal instruction and demonstration of equipment use and safety with use of the equipment. Flowsheet Row Cardiac Rehab from 02/21/2023 in [facility] Cardiac and Pulmonary Rehab  Date 02/21/23  Educator [facility]  Instruction Review Code 1- Verbalizes Understanding       Education: Exercise Physiology & General Exercise Guidelines: - Group verbal and written instruction with models to review the exercise physiology of the cardiovascular system  and associated critical values. Provides general exercise guidelines with specific guidelines to those with heart or lung disease.    Education: Flexibility, Balance, Mind/Body Relaxation: - Group verbal and visual presentation with interactive activity on the components of exercise prescription. Introduces F.I.T.T principle from ACSM for exercise prescriptions. Reviews F.I.T.T. principles of flexibility and balance exercise training including progression. Also discusses the mind body connection.  Reviews various relaxation techniques to help reduce and manage stress (i.e. Deep breathing, progressive muscle relaxation, and visualization). Balance handout provided to take home. Written material given at graduation.   Activity Barriers & Risk Stratification:  Activity Barriers & Cardiac Risk Stratification - 03/03/23 1551       Activity Barriers & Cardiac Risk Stratification   Activity Barriers Back Problems;Joint Problems;Other (comment)    Comments Knee swelling/pain    Cardiac Risk Stratification Moderate             6 Minute Walk:  6 Minute Walk     Row Name 03/03/23 1549         6 Minute Walk   Phase Initial     Distance 735 feet     Walk Time 6 minutes     # of Rest Breaks 0     MPH 1.39     METS 1.29     RPE 13     Perceived Dyspnea  0     VO2 Peak 4.53     Symptoms No  Knees swollen upon arrival     Resting HR 100 bpm     Resting BP 112/66     Resting Oxygen Saturation  99 %     Exercise Oxygen Saturation  during 6 min walk 99 %     Max Ex. HR 113 bpm     Max Ex. BP 130/72     2 Minute Post BP 108/64              Oxygen Initial Assessment:   Oxygen Re-Evaluation:   Oxygen Discharge (Final Oxygen Re-Evaluation):   Initial Exercise Prescription:  Initial Exercise Prescription - 03/03/23 1500       Date of Initial Exercise RX and Referring Provider   Date 03/03/23    Referring Provider Dr. Marcina Millard MD      Oxygen   Maintain Oxygen  Saturation 88% or higher      Recumbant Bike   Level 1    RPM 50    Watts 15    Minutes 15    METs 1.29      NuStep   Level 1    SPM 80    Minutes 15    METs 1.29      Biostep-RELP   Level 1    SPM 50    Minutes 15    METs 1.29      Track   Laps 12    Minutes 15    METs 1.65      Prescription Details   Frequency (times per week) 2    Duration Progress to 30 minutes of continuous  aerobic without signs/symptoms of physical distress      Intensity   THRR 40-80% of Max Heartrate 115-130    Ratings of Perceived Exertion 11-13    Perceived Dyspnea 0-4      Progression   Progression Continue to progress workloads to maintain intensity without signs/symptoms of physical distress.      Resistance Training   Training Prescription Yes    Weight 2 lb    Reps 10-15             Perform Capillary Blood Glucose checks as needed.  Exercise Prescription Changes:   Exercise Prescription Changes     Row Name 03/03/23 1500 03/22/23 1400           Response to Exercise   Blood Pressure (Admit) 112/66 128/66      Blood Pressure (Exercise) 130/72 128/66      Blood Pressure (Exit) 108/64 120/68      Heart Rate (Admit) 100 bpm 92 bpm      Heart Rate (Exercise) 113 bpm 108 bpm      Heart Rate (Exit) 104 bpm 100 bpm      Oxygen Saturation (Admit) 99 % --      Oxygen Saturation (Exercise) 99 % --      Rating of Perceived Exertion (Exercise) 13 15      Perceived Dyspnea (Exercise) 0 --      Symptoms none --      Comments Results --      Duration -- Progress to 30 minutes of  aerobic without signs/symptoms of physical distress      Intensity -- THRR unchanged        Progression   Progression -- Continue to progress workloads to maintain intensity without signs/symptoms of physical distress.      Average METs -- 1.48        Resistance Training   Training Prescription -- Yes      Weight -- 2 lb      Reps -- 10-15        Recumbant Bike   Level -- 1      Watts  -- 15      Minutes -- 15      METs -- 1.4        NuStep   Level -- 1.4      Minutes -- 15      METs -- 1.4        Track   Laps -- 12      Minutes -- 15      METs -- 1.65               Exercise Comments:   Exercise Comments     Row Name 03/08/23 1039           Exercise Comments First full day of exercise!  Patient was oriented to gym and equipment including functions, settings, policies, and procedures.  Patient's individual exercise prescription and treatment plan were reviewed.  All starting workloads were established based on the results of the 6 minute walk test done at initial orientation visit.  The plan for exercise progression was also introduced and progression will be customized based on patient's performance and goals.                Exercise Goals and Review:   Exercise Goals     Row Name 03/03/23 1551             Exercise Goals  Increase Physical Activity Yes       Intervention Develop an individualized exercise prescription for aerobic and resistive training based on initial evaluation findings, risk stratification, comorbidities and participant's personal goals.;Provide advice, education, support and counseling about physical activity/exercise needs.       Expected Outcomes Short Term: Attend rehab on a regular basis to increase amount of physical activity.;Long Term: Add in home exercise to make exercise part of routine and to increase amount of physical activity.;Long Term: Exercising regularly at least 3-5 days a week.       Increase Strength and Stamina Yes       Intervention Provide advice, education, support and counseling about physical activity/exercise needs.;Develop an individualized exercise prescription for aerobic and resistive training based on initial evaluation findings, risk stratification, comorbidities and participant's personal goals.       Expected Outcomes Short Term: Perform resistance training exercises routinely during rehab  and add in resistance training at home;Short Term: Increase workloads from initial exercise prescription for resistance, speed, and METs.;Long Term: Improve cardiorespiratory fitness, muscular endurance and strength as measured by increased METs and functional capacity ( )       Able to understand and use rate of perceived exertion (RPE) scale Yes       Intervention Provide education and explanation on how to use RPE scale       Expected Outcomes Short Term: Able to use RPE daily in rehab to express subjective intensity level;Long Term:  Able to use RPE to guide intensity level when exercising independently       Able to understand and use Dyspnea scale Yes       Intervention Provide education and explanation on how to use Dyspnea scale       Expected Outcomes Long Term: Able to use Dyspnea scale to guide intensity level when exercising independently;Short Term: Able to use Dyspnea scale daily in rehab to express subjective sense of shortness of breath during exertion       Knowledge and understanding of Target Heart Rate Range (THRR) Yes       Intervention Provide education and explanation of THRR including how the numbers were predicted and where they are located for reference       Expected Outcomes Short Term: Able to state/look up THRR;Long Term: Able to use THRR to govern intensity when exercising independently;Short Term: Able to use daily as guideline for intensity in rehab       Able to check pulse independently Yes       Intervention Provide education and demonstration on how to check pulse in carotid and radial arteries.;Review the importance of being able to check your own pulse for safety during independent exercise       Expected Outcomes Short Term: Able to explain why pulse checking is important during independent exercise       Understanding of Exercise Prescription Yes       Intervention Provide education, explanation, and written materials on patient's individual exercise  prescription       Expected Outcomes Short Term: Able to explain program exercise prescription;Long Term: Able to explain home exercise prescription to exercise independently                Exercise Goals Re-Evaluation :  Exercise Goals Re-Evaluation     Row Name 03/08/23 1039 03/22/23 1435 04/07/23 1504 04/20/23 0942 05/06/23 0856     Exercise Goal Re-Evaluation   Exercise Goals Review Able to understand and use rate of perceived exertion (  RPE) scale;Knowledge and understanding of Target Heart Rate Range (THRR);Able to understand and use Dyspnea scale;Understanding of Exercise Prescription Increase Physical Activity;Understanding of Exercise Prescription;Increase Strength and Stamina Increase Physical Activity;Understanding of Exercise Prescription;Increase Strength and Stamina Increase Physical Activity;Understanding of Exercise Prescription;Increase Strength and Stamina Increase Physical Activity;Understanding of Exercise Prescription;Increase Strength and Stamina   Comments Reviewed RPE  and dyspnea scale, THR and program prescription with pt today.  Pt voiced understanding and was given a copy of goals to take home. Katie Beltran is off to a start in the program, she has completed one exercise session. We will continue to monitor her progress in the program. Katie Beltran has not attended rehab since 03/08/2023. We will reach out to see when she plans to return to rehab. We will continue to monitor her progress once she returns to the program. Katie Beltran has not attended rehab since 03/08/2023. She has been placed on medical hold due to her severe arthritis and is waiting to see her rheumatologist. We will continue to monitor her progress once she returns to the program. Katie Beltran continues to be out on medical hold due to her severe arthritis and is waiting to see her rheumatologist on 05/09/2023. She called and expressed that she would like to continue in the program but will make a final decision after her rheumatology  appt. We will continue to monitor her progress once she returns to the program.   Expected Outcomes Short: Use RPE daily to regulate intensity. Long: Follow program prescription in THR. Short: Continue to follow current exercise prescription. Long: Continue exercise to improve strength and stamina. Short: Return to rehab when appropriate. Long: Continue exercise to improve strength and stamina. Short: Return to rehab when appropriate. Long: Continue exercise to improve strength and stamina. Short: Return to rehab when appropriate. Long: Continue exercise to improve strength and stamina.    Row Name 05/18/23 1324             Exercise Goal Re-Evaluation   Exercise Goals Review Increase Physical Activity;Understanding of Exercise Prescription;Increase Strength and Stamina       Comments Katie Beltran continues to be out on medical hold due to her severe arthritis. We recently spoke with her as she had her rheumatology appt and had Xray and lab work done. She will call us when she has results and knows the plan moving forward. She has been walking almost daily but her knees are still swelling. Will wait to hear back from pt regarding when she can resume rehab. We will continue to monitor her progress once she returns to the program.       Expected Outcomes Short: Return to rehab when appropriate. Long: Continue exercise to improve strength and stamina.                Discharge Exercise Prescription (Final Exercise Prescription Changes):  Exercise Prescription Changes - 03/22/23 1400       Response to Exercise   Blood Pressure (Admit) 128/66    Blood Pressure (Exercise) 128/66    Blood Pressure (Exit) 120/68    Heart Rate (Admit) 92 bpm    Heart Rate (Exercise) 108 bpm    Heart Rate (Exit) 100 bpm    Rating of Perceived Exertion (Exercise) 15    Duration Progress to 30 minutes of  aerobic without signs/symptoms of physical distress    Intensity THRR unchanged      Progression   Progression  Continue to progress workloads to maintain intensity without signs/symptoms of physical distress.  Average METs 1.48      Resistance Training   Training Prescription Yes    Weight 2 lb    Reps 10-15      Recumbant Bike   Level 1    Watts 15    Minutes 15    METs 1.4      NuStep   Level 1.4    Minutes 15    METs 1.4      Track   Laps 12    Minutes 15    METs 1.65             Nutrition:  Target Goals: Understanding of nutrition guidelines, daily intake of sodium 1500mg , cholesterol 200mg , calories 30% from fat and 7% or less from saturated fats, daily to have 5 or more servings of fruits and vegetables.  Education: All About Nutrition: -Group instruction provided by verbal, written material, interactive activities, discussions, models, and posters to present general guidelines for heart healthy nutrition including fat, fiber, MyPlate, the role of sodium in heart healthy nutrition, utilization of the nutrition label, and utilization of this knowledge for meal planning. Follow up email sent as well. Written material given at graduation.   Biometrics:  Pre Biometrics - 03/03/23 1552       Pre Biometrics   Height 5' 2.5" (1.588 m)    Weight 152 lb 6.4 oz (69.1 kg)    Waist Circumference 33 inches    Hip Circumference 40 inches    Waist to Hip Ratio 0.83 %    BMI (Calculated) 27.41    Single Leg Stand 9.6 seconds              Nutrition Therapy Plan and Nutrition Goals:   Nutrition Assessments:  MEDIFICTS Score Key: >=70 Need to make dietary changes  40-70 Heart Healthy Diet <= 40 Therapeutic Level Cholesterol Diet  Flowsheet Row Cardiac Rehab from 03/08/2023 in [facility] Cardiac and Pulmonary Rehab  Picture Your Plate Total Score on Admission 75      Picture Your Plate Scores: <40 Unhealthy dietary pattern with much room for improvement. 41-50 Dietary pattern unlikely to meet recommendations for good health and room for improvement. 51-60 More  healthful dietary pattern, with some room for improvement.  >60 Healthy dietary pattern, although there may be some specific behaviors that could be improved.    Nutrition Goals Re-Evaluation:   Nutrition Goals Discharge (Final Nutrition Goals Re-Evaluation):   Psychosocial: Target Goals: Acknowledge presence or absence of significant depression and/or stress, maximize coping skills, provide positive support system. Participant is able to verbalize types and ability to use techniques and skills needed for reducing stress and depression.   Education: Stress, Anxiety, and Depression - Group verbal and visual presentation to define topics covered.  Reviews how body is impacted by stress, anxiety, and depression.  Also discusses healthy ways to reduce stress and to treat/manage anxiety and depression.  Written material given at graduation.   Education: Sleep Hygiene -Provides group verbal and written instruction about how sleep can affect your health.  Define sleep hygiene, discuss sleep cycles and impact of sleep habits. Review good sleep hygiene tips.    Initial Review & Psychosocial Screening:  Initial Psych Review & Screening - 02/21/23 1112       Initial Review   Current issues with Current Sleep Concerns    Comments Kattia has had a bit of trouble sleeping. She feels half awake at times and is not getting adequate sleep. She can look to her three  children and her husband for support.      Family Dynamics   Good Support System? Yes      Barriers   Psychosocial barriers to participate in program There are no identifiable barriers or psychosocial needs.;The patient should benefit from training in stress management and relaxation.      Screening Interventions   Interventions Encouraged to exercise;To provide support and resources with identified psychosocial needs;Provide feedback about the scores to participant    Expected Outcomes Short Term goal: Utilizing psychosocial counselor,  staff and physician to assist with identification of specific Stressors or current issues interfering with healing process. Setting desired goal for each stressor or current issue identified.;Long Term Goal: Stressors or current issues are controlled or eliminated.;Short Term goal: Identification and review with participant of any Quality of Life or Depression concerns found by scoring the questionnaire.;Long Term goal: The participant improves quality of Life and PHQ9 Scores as seen by post scores and/or verbalization of changes             Quality of Life Scores:   Quality of Life - 03/08/23 1130       Quality of Life   Select Quality of Life      Quality of Life Scores   Health/Function Pre 19.21 %    Socioeconomic Pre 21.93 %    Psych/Spiritual Pre 23.57 %    Family Pre 239 %    GLOBAL Pre 21.42 %            Scores of 19 and below usually indicate a poorer quality of life in these areas.  A difference of  2-3 points is a clinically meaningful difference.  A difference of 2-3 points in the total score of the Quality of Life Index has been associated with significant improvement in overall quality of life, self-image, physical symptoms, and general health in studies assessing change in quality of life.  PHQ-9: Review Flowsheet       03/03/2023  Depression screen PHQ 2/9  Decreased Interest 3  Down, Depressed, Hopeless 1  PHQ - 2 Score 4  Altered sleeping 0  Tired, decreased energy 1  Change in appetite 0  Feeling bad or failure about yourself  0  Trouble concentrating 1  Moving slowly or fidgety/restless 0  Suicidal thoughts 0  PHQ-9 Score 6  Difficult doing work/chores Somewhat difficult    Details           Interpretation of Total Score  Total Score Depression Severity:  1-4 = Minimal depression, 5-9 = Mild depression, 10-14 = Moderate depression, 15-19 = Moderately severe depression, 20-27 = Severe depression   Psychosocial Evaluation and Intervention:   Psychosocial Evaluation - 02/21/23 1113       Psychosocial Evaluation & Interventions   Interventions Encouraged to exercise with the program and follow exercise prescription;Relaxation education;Stress management education    Comments Lenna has had a bit of trouble sleeping. She feels half awake at times and is not getting adequate sleep. She can look to her three children and her husband for support.    Expected Outcomes Short: Start [facility] to help with mood. Long: Maintain a healthy mental state    Continue Psychosocial Services  Follow up required by staff             Psychosocial Re-Evaluation:   Psychosocial Discharge (Final Psychosocial Re-Evaluation):   Vocational Rehabilitation: Provide vocational rehab assistance to qualifying candidates.   Vocational Rehab Evaluation & Intervention:   Education: Education Goals:  Education classes will be provided on a variety of topics geared toward better understanding of heart health and risk factor modification. Participant will state understanding/return demonstration of topics presented as noted by education test scores.  Learning Barriers/Preferences:  Learning Barriers/Preferences - 02/21/23 1110       Learning Barriers/Preferences   Learning Barriers None    Learning Preferences None             General Cardiac Education Topics:  AED/CPR: - Group verbal and written instruction with the use of models to demonstrate the basic use of the AED with the basic ABC's of resuscitation.   Anatomy and Cardiac Procedures: - Group verbal and visual presentation and models provide information about basic cardiac anatomy and function. Reviews the testing methods done to diagnose heart disease and the outcomes of the test results. Describes the treatment choices: Medical Management, Angioplasty, or Coronary Bypass Surgery for treating various heart conditions including Myocardial Infarction, Angina, Valve Disease, and Cardiac  Arrhythmias.  Written material given at graduation.   Medication Safety: - Group verbal and visual instruction to review commonly prescribed medications for heart and lung disease. Reviews the medication, class of the drug, and side effects. Includes the steps to properly store meds and maintain the prescription regimen.  Written material given at graduation.   Intimacy: - Group verbal instruction through game format to discuss how heart and lung disease can affect sexual intimacy. Written material given at graduation..   Know Your Numbers and Heart Failure: - Group verbal and visual instruction to discuss disease risk factors for cardiac and pulmonary disease and treatment options.  Reviews associated critical values for Overweight/Obesity, Hypertension, Cholesterol, and Diabetes.  Discusses basics of heart failure: signs/symptoms and treatments.  Introduces Heart Failure Zone chart for action plan for heart failure.  Written material given at graduation.   Infection Prevention: - Provides verbal and written material to individual with discussion of infection control including proper hand washing and proper equipment cleaning during exercise session. Flowsheet Row Cardiac Rehab from 02/21/2023 in [facility] Cardiac [facility] Pulmonary Rehab  Date 02/21/23  Educator [facility]  Instruction Review Code 1- Verbalizes Understanding       Falls Prevention: - Provides verbal and written material to individual with discussion of falls prevention and safety. Flowsheet Row Cardiac Rehab from 02/21/2023 in [facility] Cardiac [facility] Pul[facility] Rehab  Date 02/21/23  Educator [facility]  Instruction Review Code 1- Verbalizes Understanding       Other: -Provides group and verbal instruction on various topics (see comments)   Knowledge Questionnaire Score:  Knowledge Questionnaire Score - 03/08/23 1128       Knowledge Questionnaire Score   Pre Score 15/26             Core Components/Risk Factors/Patient Goals at  Admission:  Personal Goals and Risk Factors at Admission - 02/21/23 1111       Core Components/Risk Factors/Patient Goals on Admission    Weight Management Yes;Weight Maintenance    Intervention Weight Management: Develop a combined nutrition and exercise program designed to reach desired caloric intake, while maintaining appropriate intake of nutrient and fiber, sodium and fats, and appropriate energy expenditure required for the weight goal.;Weight Management: Provide education and appropriate resources to help participant work on and attain dietary goals.;Weight Management/Obesity: Establish reasonable short term and long term weight goals.    Expected Outcomes Short Term: Continue to assess and modify interventions until short term weight is achieved;Long Term: Adherence to nutrition and physical activity/exercise program aimed  toward attainment of established weight goal;Weight Maintenance: Understanding of the daily nutrition guidelines, which includes 25-35% calories from fat, 7% or less cal from saturated fats, less than 200mg  cholesterol, less than 1.5gm of sodium, & 5 or more servings of fruits and vegetables daily;Understanding recommendations for meals to include 15-35% energy as protein, 25-35% energy from fat, 35-60% energy from carbohydrates, less than 200mg  of dietary cholesterol, 20-35 gm of total fiber daily;Understanding of distribution of calorie intake throughout the day with the consumption of 4-5 meals/snacks    Heart Failure Yes    Intervention Provide a combined exercise and nutrition program that is supplemented with education, support and counseling about heart failure. Directed toward relieving symptoms such as shortness of breath, decreased exercise tolerance, and extremity edema.    Expected Outcomes Improve functional capacity of life;Short term: Attendance in program 2-3 days a week with increased exercise capacity. Reported lower sodium intake. Reported increased fruit and  vegetable intake. Reports medication compliance.;Short term: Daily weights obtained and reported for increase. Utilizing diuretic protocols set by physician.;Long term: Adoption of self-care skills and reduction of barriers for early signs and symptoms recognition and intervention leading to self-care maintenance.    Hypertension Yes    Intervention Provide education on lifestyle modifcations including regular physical activity/exercise, weight management, moderate sodium restriction and increased consumption of fresh fruit, vegetables, and low fat dairy, alcohol moderation, and smoking cessation.;Monitor prescription use compliance.    Expected Outcomes Short Term: Continued assessment and intervention until BP is < 140/27mm HG in hypertensive participants. < 130/55mm HG in hypertensive participants with diabetes, heart failure or chronic kidney disease.;Long Term: Maintenance of blood pressure at goal levels.    Lipids Yes    Intervention Provide education and support for participant on nutrition & aerobic/resistive exercise along with prescribed medications to achieve LDL 70mg , HDL >40mg .    Expected Outcomes Short Term: Participant states understanding of desired cholesterol values and is compliant with medications prescribed. Participant is following exercise prescription and nutrition guidelines.;Long Term: Cholesterol controlled with medications as prescribed, with individualized exercise RX and with personalized nutrition plan. Value goals: LDL < 70mg , HDL > 40 mg.             Education:Diabetes - Individual verbal and written instruction to review signs/symptoms of diabetes, desired ranges of glucose level fasting, after meals and with exercise. Acknowledge that pre and post exercise glucose checks will be done for 3 sessions at entry of program.   Core Components/Risk Factors/Patient Goals Review:    Core Components/Risk Factors/Patient Goals at Discharge (Final Review):    ITP  Comments:  ITP Comments     Row Name 02/21/23 1110 03/03/23 1547 03/08/23 1039 03/30/23 1255 04/11/23 1521   ITP Comments Virtual Visit completed. Patient informed on EP and RD appointment and 6 Minute walk test. Patient also informed of patient health questionnaires on My Chart. Patient Verbalizes understanding. Visit diagnosis can be found in [facility] 01/27/2023. Completed and gym orientation. Initial ITP created and sent for review to Dr. Bethann Punches, Medical Director. First full day of exercise!  Patient was oriented to gym and equipment including functions, settings, policies, and procedures.  Patient's individual exercise prescription and treatment plan were reviewed.  All starting workloads were established based on the results of the 6 minute walk test done at initial orientation visit.  The plan for exercise progression was also introduced and progression will be customized based on patient's performance and goals. 30 Day review completed. Medical Director ITP  review done, changes made as directed, and signed approval by Medical Director.    new to program Called to check on Katie Beltran, she has not been to rehab since 9/10. Katie Beltran said she had called and spoke with staff to make aware she has been waiting to get scheduled with rheumatologist for her arthritis. She has had issues with getting this appointment scheduled. She tried to work out on a bike at home and was in sever pain the next day. She will need to be placed on medical hold until she sees Dr. Allena Katz with [facility] rheumatology. She is hoping to hear from them within the week. I have cancelled her appointments next week. She will let us know when she is scheduled. Otherwise, I told her I will follow up with her in a week if I have not heard back. Katie Beltran appreciative and voiced understanding.    Row Name 04/21/23 1315 04/27/23 1043 05/11/23 1717 05/18/23 1002 05/18/23 1349   ITP Comments Spoke with Katie Beltran. She has a rheumatology appointment scheduled 11/11.  She does want to continue rehab if she is able. Will follow up with her after this appointment to discuss plan moving forward and determine if she will be able to resume rehab. Will keep her on medical hold until this appointment. 30 Day review completed. Medical Director ITP review done, changes made as directed, and signed approval by Medical Director. on hold medical Spoke with Katie Beltran. She had her rheumatology appt today. Had Xray and lab work done. She will call us when she has results and knows the plan moving forward. She has been walking almost daily but her knees are still swelling. Will wait to hear back from pt regarding when she can resume rehab. 30 Day review completed. Medical Director ITP review done, changes made as directed, and signed approval by Medical Director.    remains out with medical issue Attempted to call pt to follow up. No answer at this time. Lmtcb.    Row Name 05/23/23 1423           ITP Comments Spoke with Katie Beltran. She still has not received her results from her rheumatology appointment, however, she states she would like to try water aerobics at the [facility] with silver sneakers. She is not sure about resuming rehab at this time with her issues with arthritis. We did discuss that she may return to rehab in the future with a new referral if she is unable to attend class at this time. She also was having issues with transportation. Katie Beltran agreed to discharge at this time from cardiac rehab. She may try to return after the beginning of the year.                Comments: Discharge ITP

## 2023-05-23 NOTE — Telephone Encounter (Signed)
Spoke with Katie Beltran. She still has not received her results from her rheumatology appointment, however, she states she would like to try water aerobics at the [facility] with silver sneakers. She is not sure about resuming rehab at this time with her issues with arthritis. We did discuss that she may return to rehab in the future with a new referral if she is unable to attend class at this time. She also was having issues with transportation. Katie Beltran agreed to discharge at this time from cardiac rehab. She may try to return after the beginning of the year.

## 2023-06-06 ENCOUNTER — Ambulatory Visit
Admission: RE | Admit: 2023-06-06 | Discharge: 2023-06-06 | Disposition: A | Discharge status: Home / Self Care | Payer: Medicare HMO | Source: Ambulatory Visit | Attending: Internal Medicine | Admitting: Internal Medicine

## 2023-06-06 DIAGNOSIS — R059 Cough, unspecified: Secondary | ICD-10-CM

## 2023-09-23 ENCOUNTER — Other Ambulatory Visit: Payer: Self-pay | Admitting: Internal Medicine

## 2023-09-23 DIAGNOSIS — Z1231 Encounter for screening mammogram for malignant neoplasm of breast: Secondary | ICD-10-CM

## 2023-10-10 ENCOUNTER — Ambulatory Visit
Admission: RE | Admit: 2023-10-10 | Discharge: 2023-10-10 | Disposition: A | Discharge status: Home / Self Care | Source: Ambulatory Visit | Attending: Internal Medicine | Admitting: Internal Medicine

## 2023-10-10 DIAGNOSIS — Z1231 Encounter for screening mammogram for malignant neoplasm of breast: Secondary | ICD-10-CM | POA: Diagnosis present
